# Patient Record
Sex: Male | Born: 1960 | Race: Black or African American | Hispanic: No | Marital: Single | Smoking: Former smoker
Health system: Southern US, Community
[De-identification: ages and names within clinical notes are randomized; demographics above are authoritative.]

## PROBLEM LIST (undated history)

## (undated) DIAGNOSIS — I10 Essential (primary) hypertension: Secondary | ICD-10-CM

## (undated) HISTORY — PX: TONSILLECTOMY: SUR1361

---

## 2001-05-20 ENCOUNTER — Emergency Department (HOSPITAL_COMMUNITY): Admission: EM | Admit: 2001-05-20 | Discharge: 2001-05-20 | Payer: Self-pay | Admitting: Emergency Medicine

## 2004-04-30 ENCOUNTER — Emergency Department (HOSPITAL_COMMUNITY): Admission: EM | Admit: 2004-04-30 | Discharge: 2004-04-30 | Payer: Self-pay | Admitting: Emergency Medicine

## 2006-05-19 ENCOUNTER — Emergency Department (HOSPITAL_COMMUNITY): Admission: EM | Admit: 2006-05-19 | Discharge: 2006-05-20 | Payer: Self-pay | Admitting: Emergency Medicine

## 2011-04-16 ENCOUNTER — Emergency Department (HOSPITAL_COMMUNITY)
Admission: EM | Admit: 2011-04-16 | Discharge: 2011-04-16 | Disposition: A | Discharge status: Home / Self Care | Payer: BC Managed Care – PPO | Attending: Emergency Medicine | Admitting: Emergency Medicine

## 2011-04-16 DIAGNOSIS — W540XXA Bitten by dog, initial encounter: Secondary | ICD-10-CM | POA: Insufficient documentation

## 2011-04-16 DIAGNOSIS — S61209A Unspecified open wound of unspecified finger without damage to nail, initial encounter: Secondary | ICD-10-CM | POA: Insufficient documentation

## 2011-12-18 HISTORY — PX: COLONOSCOPY: SHX174

## 2012-04-26 ENCOUNTER — Emergency Department (HOSPITAL_COMMUNITY): Payer: Worker's Compensation

## 2012-04-26 ENCOUNTER — Encounter (HOSPITAL_COMMUNITY): Payer: Self-pay | Admitting: *Deleted

## 2012-04-26 ENCOUNTER — Emergency Department (HOSPITAL_COMMUNITY)
Admission: EM | Admit: 2012-04-26 | Discharge: 2012-04-26 | Disposition: A | Discharge status: Home / Self Care | Payer: Worker's Compensation | Attending: Emergency Medicine | Admitting: Emergency Medicine

## 2012-04-26 DIAGNOSIS — W268XXA Contact with other sharp object(s), not elsewhere classified, initial encounter: Secondary | ICD-10-CM | POA: Insufficient documentation

## 2012-04-26 DIAGNOSIS — Y9289 Other specified places as the place of occurrence of the external cause: Secondary | ICD-10-CM | POA: Insufficient documentation

## 2012-04-26 DIAGNOSIS — I10 Essential (primary) hypertension: Secondary | ICD-10-CM | POA: Insufficient documentation

## 2012-04-26 DIAGNOSIS — S61512A Laceration without foreign body of left wrist, initial encounter: Secondary | ICD-10-CM

## 2012-04-26 DIAGNOSIS — S61509A Unspecified open wound of unspecified wrist, initial encounter: Secondary | ICD-10-CM | POA: Insufficient documentation

## 2012-04-26 HISTORY — DX: Essential (primary) hypertension: I10

## 2012-04-26 MED ORDER — BACITRACIN ZINC 500 UNIT/GM EX OINT
1.0000 "application " | TOPICAL_OINTMENT | Freq: Two times a day (BID) | CUTANEOUS | Status: DC
  Administered 2012-04-26: 1 via TOPICAL
  Filled 2012-04-26: qty 0.9

## 2012-04-26 MED ORDER — CEPHALEXIN 500 MG PO CAPS: 500.0000 mg | ORAL_CAPSULE | Freq: Two times a day (BID) | ORAL | Status: AC

## 2012-04-26 MED ORDER — BACITRACIN 500 UNIT/GM EX OINT: 1.0000 "application " | TOPICAL_OINTMENT | Freq: Two times a day (BID) | CUTANEOUS | Status: DC

## 2012-04-26 MED ORDER — LORAZEPAM 1 MG PO TABS
1.0000 mg | ORAL_TABLET | Freq: Once | ORAL | Status: AC
  Administered 2012-04-26: 1 mg via ORAL
  Filled 2012-04-26: qty 1

## 2012-04-26 NOTE — Discharge Instructions (Signed)
Please read the instructions below.  Keep your wound clean and covered with a thin layer of antibiotic ointment.  You should be seen by the ER, urgent care, or your primary care provider in 14 days for a wound check and suture removal.  Return to the ER immediately if you develop redness, swelling, pus draining from the wound, or fevers greater than 100.4.   You may return to the ER at any time for worsening condition or any new symptoms that concern you.  Laceration Care, Adult A laceration is a cut or lesion that goes through all layers of the skin and into the tissue just beneath the skin. TREATMENT  Some lacerations may not require closure. Some lacerations may not be able to be closed due to an increased risk of infection. It is important to see your caregiver as soon as possible after an injury to minimize the risk of infection and maximize the opportunity for successful closure. If closure is appropriate, pain medicines may be given, if needed. The wound will be cleaned to help prevent infection. Your caregiver will use stitches (sutures), staples, wound glue (adhesive), or skin adhesive strips to repair the laceration. These tools bring the skin edges together to allow for faster healing and a better cosmetic outcome. However, all wounds will heal with a scar. Once the wound has healed, scarring can be minimized by covering the wound with sunscreen during the day for 1 full year. HOME CARE INSTRUCTIONS  For sutures or staples:  Keep the wound clean and dry.   If you were given a bandage (dressing), you should change it at least once a day. Also, change the dressing if it becomes wet or dirty, or as directed by your caregiver.   Wash the wound with soap and water 2 times a day. Rinse the wound off with water to remove all soap. Pat the wound dry with a clean towel.   After cleaning, apply a thin layer of the antibiotic ointment as recommended by your caregiver. This will help prevent infection  and keep the dressing from sticking.   You may shower as usual after the first 24 hours. Do not soak the wound in water until the sutures are removed.   Only take over-the-counter or prescription medicines for pain, discomfort, or fever as directed by your caregiver.   Get your sutures or staples removed as directed by your caregiver.  For skin adhesive strips:  Keep the wound clean and dry.   Do not get the skin adhesive strips wet. You may bathe carefully, using caution to keep the wound dry.   If the wound gets wet, pat it dry with a clean towel.   Skin adhesive strips will fall off on their own. You may trim the strips as the wound heals. Do not remove skin adhesive strips that are still stuck to the wound. They will fall off in time.  For wound adhesive:  You may briefly wet your wound in the shower or bath. Do not soak or scrub the wound. Do not swim. Avoid periods of heavy perspiration until the skin adhesive has fallen off on its own. After showering or bathing, gently pat the wound dry with a clean towel.   Do not apply liquid medicine, cream medicine, or ointment medicine to your wound while the skin adhesive is in place. This may loosen the film before your wound is healed.   If a dressing is placed over the wound, be careful not to apply  tape directly over the skin adhesive. This may cause the adhesive to be pulled off before the wound is healed.   Avoid prolonged exposure to sunlight or tanning lamps while the skin adhesive is in place. Exposure to ultraviolet light in the first year will darken the scar.   The skin adhesive will usually remain in place for 5 to 10 days, then naturally fall off the skin. Do not pick at the adhesive film.  You may need a tetanus shot if:  You cannot remember when you had your last tetanus shot.   You have never had a tetanus shot.  If you get a tetanus shot, your arm may swell, get red, and feel warm to the touch. This is common and not a  problem. If you need a tetanus shot and you choose not to have one, there is a rare chance of getting tetanus. Sickness from tetanus can be serious. SEEK MEDICAL CARE IF:   You have redness, swelling, or increasing pain in the wound.   You see a red line that goes away from the wound.   You have yellowish-white fluid (pus) coming from the wound.   You have a fever.   You notice a bad smell coming from the wound or dressing.   Your wound breaks open before or after sutures have been removed.   You notice something coming out of the wound such as wood or glass.   Your wound is on your hand or foot and you cannot move a finger or toe.  SEEK IMMEDIATE MEDICAL CARE IF:   Your pain is not controlled with prescribed medicine.   You have severe swelling around the wound causing pain and numbness or a change in color in your arm, hand, leg, or foot.   Your wound splits open and starts bleeding.   You have worsening numbness, weakness, or loss of function of any joint around or beyond the wound.   You develop painful lumps near the wound or on the skin anywhere on your body.  MAKE SURE YOU:   Understand these instructions.   Will watch your condition.   Will get help right away if you are not doing well or get worse.  Document Released: 12/03/2005 Document Revised: 11/22/2011 Document Reviewed: 05/29/2011 [facility] Patient Information 2012 [place], [place].  Stitches, Staples, or Skin Adhesive Strips  Stitches (sutures), staples, and skin adhesive strips hold the skin together as it heals. They will usually be in place for 7 days or less. HOME CARE  Wash your hands with soap and water before and after you touch your wound.   Only take medicine as told by your doctor.   Cover your wound only if your doctor told you to. Otherwise, leave it open to air.   Do not get your stitches wet or dirty. If they get dirty, dab them gently with a clean washcloth. Wet the washcloth with soapy  water. Do not rub. Pat them dry gently.   Do not put medicine or medicated cream on your stitches unless your doctor told you to.   Do not take out your own stitches or staples. Skin adhesive strips will fall off by themselves.   Do not pick at the wound. Picking can cause an infection.   Do not miss your follow-up appointment.   If you have problems or questions, call your doctor.  GET HELP RIGHT AWAY IF:   You have a temperature by mouth above 102 F (38.9 C), not controlled  by medicine.   You have chills.   You have redness or pain around your stitches.   There is puffiness (swelling) around your stitches.   You notice fluid (drainage) from your stitches.   There is a bad smell coming from your wound.  MAKE SURE YOU:  Understand these instructions.   Will watch your condition.   Will get help if you are not doing well or get worse.  Document Released: 09/30/2009 Document Revised: 11/22/2011 Document Reviewed: 09/30/2009 [facility] Patient Information 2012 [place], [place].

## 2012-04-26 NOTE — ED Provider Notes (Signed)
History     CSN: [id number]  Arrival date & time 04/26/12  1009   First MD Initiated Contact with Patient 04/26/12 1011      Chief Complaint  Patient presents with  . 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  States he was seen at urgent care and sent to ED because it was "too deep."  Denies any weakness, numbness, tingling, or difficulty moving his hand.  Denies other injury.    The history is provided by the patient.    Past Medical History  Diagnosis Date  . Hypertension     History reviewed. No pertinent past surgical history.  History reviewed. No pertinent family history.  History  Substance Use Topics  . Smoking status: Current Everyday Smoker -- 2.0 packs/day    Types: Cigarettes  . Smokeless tobacco: Never Used  . Alcohol Use:      every weekends      Review of Systems  Skin: Positive for wound.  Neurological: Negative for weakness and numbness.    Allergies  Review of patient's allergies indicates no known allergies.  Home Medications   Current Outpatient Rx  Name Route Sig Dispense Refill  . HYDROCHLOROTHIAZIDE 25 MG PO TABS Oral Take 25 mg by mouth daily.      BP 169/110  Pulse 84  Temp(Src) 98.9 F (37.2 C) (Oral)  Resp 18  Wt 185 lb (83.915 kg)  SpO2 100%  Physical Exam  Nursing note and vitals reviewed. Constitutional: He is oriented to person, place, and time. He appears well-developed and well-nourished.  HENT:  Head: Normocephalic and atraumatic.  Neck: Neck supple.  Pulmonary/Chest: Effort normal.  Musculoskeletal:       Left wrist: He exhibits laceration. He exhibits normal range of motion.       Left hand: He exhibits normal range of motion and normal capillary refill. normal sensation  noted. Normal strength noted.       Laceration over left wrist into muscle and fat.  No Tendon involvement.  Patient with full AROM, 5/5 strength, full sensation throughout left hand and wrist, capillary refill < 2 seconds.    Neurological: He is alert and oriented to person, place, and time.  Skin: He is not diaphoretic.    ED Course  Procedures (including critical care time)  Labs Reviewed - No data to display No results found.  11:07 AM Patient also seen and examined by Dr Juleen China.  Neurovascularly intact.  Plan is for xray, thorough irrigation and closure in ED.  Pt very anxious about needles, will given ativan prior to procedure.    LACERATION REPAIR Performed by: Rise Patience Consent: Verbal consent obtained. Risks and benefits: risks, benefits and alternatives were discussed Patient identity confirmed: provided demographic data Time out performed prior to procedure Prepped and Draped in normal sterile fashion Wound explored  Laceration Location: left wrist  Laceration Length: 5cm  No Foreign Bodies seen or palpated  Anesthesia: local infiltration  Local anesthetic: lidocaine 2% no epinephrine  Anesthetic total: 5 ml  Irrigation method: syringe Amount of cleaning: standard  Skin closure: 4-0 vicryl and 4-0 prolene  Number of sutures or staples: 1 running and 9 simple interrupted  Technique: running subcuticular plus simple interrupted.   Patient tolerance: Patient tolerated the procedure well with no immediate complications.  Tetanus vx given last year.   1. Laceration of left wrist       MDM  Patient with deep laceration of left wrist, no tendon involvement.  Neurovascularly intact.  Tetanus is UTD.  Xray negative.  No FB seen on exam.  Wound thoroughly irrigated, closed with running subcuticular followed by simple interrupted sutures.  Wound hemostatic, came together well.  Pt advised he will have a scar.  Discussed return precautions.  Pt to return or see  PCP in 2 weeks for wound recheck and suture removal.  Patient and wife verbalize understanding and agree with plan.          Rise Patience, [place] 04/26/12 1510

## 2012-04-26 NOTE — ED Notes (Signed)
Patient transported to Xray with tech

## 2012-04-26 NOTE — ED Notes (Signed)
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 Pt reports being told at Urgent Care that laceration was too deep and that he needed to come to the ED.

## 2012-04-26 NOTE — ED Notes (Signed)
Pt returned from radiology with tech.

## 2012-04-28 NOTE — ED Provider Notes (Signed)
Medical screening examination/treatment/procedure(s) were conducted as a shared visit with non-physician practitioner(s) and myself.  I personally evaluated the patient during the encounter.  Patient with a complex laceration to the ulnar aspect of the distal left wrist. Subcutaneous fat exposed. Does appear to be a small muscle involvement. No evidence of tendon involvement and was visualized throughout range of motion of the wrist. Strength is good against resistance. Neurovascular intact distally. Plan very thorough irrigation. X-ray to evaluate for possible foreign body although doubt given mechanism described. Will require closure in layers. Continued wound care and followup for suture removal. Return precautions sooner were discussed.    Raeford Razor, MD 04/28/12 1038

## 2012-06-20 ENCOUNTER — Ambulatory Visit (AMBULATORY_SURGERY_CENTER): Payer: BC Managed Care – PPO | Admitting: *Deleted

## 2012-06-20 VITALS — Ht 69.0 in | Wt 174.5 lb

## 2012-06-20 DIAGNOSIS — Z1211 Encounter for screening for malignant neoplasm of colon: Secondary | ICD-10-CM

## 2012-06-20 MED ORDER — MOVIPREP 100 G PO SOLR: ORAL | Status: DC

## 2012-06-20 NOTE — Progress Notes (Signed)
Pt has no allergies to soy or egg products

## 2012-06-23 ENCOUNTER — Encounter: Payer: Self-pay | Admitting: Gastroenterology

## 2012-06-24 NOTE — Addendum Note (Signed)
Addended by: Maple Hudson on: 06/24/2012 04:25 PM   Modules accepted: Level of Service

## 2012-07-02 ENCOUNTER — Encounter: Payer: Self-pay | Admitting: Gastroenterology

## 2012-07-02 ENCOUNTER — Ambulatory Visit (AMBULATORY_SURGERY_CENTER): Payer: BC Managed Care – PPO | Admitting: Gastroenterology

## 2012-07-02 VITALS — BP 132/91 | HR 69 | Temp 97.0°F | Resp 18 | Ht 69.0 in | Wt 174.0 lb

## 2012-07-02 DIAGNOSIS — K5730 Diverticulosis of large intestine without perforation or abscess without bleeding: Secondary | ICD-10-CM

## 2012-07-02 DIAGNOSIS — Z1211 Encounter for screening for malignant neoplasm of colon: Secondary | ICD-10-CM

## 2012-07-02 DIAGNOSIS — D126 Benign neoplasm of colon, unspecified: Secondary | ICD-10-CM

## 2012-07-02 MED ORDER — SODIUM CHLORIDE 0.9 % IV SOLN: 500.0000 mL | INTRAVENOUS | Status: DC

## 2012-07-02 NOTE — Op Note (Signed)
[facility] [address] [place], [place]  [address]  COLONOSCOPY PROCEDURE REPORT  PATIENT:  Dean, Brooks  MR#:  [id number] BIRTHDATE:  06/23/1961, 50 yrs. old  GENDER:  male ENDOSCOPIST:  Vania Rea. Jarold Motto, MD, [facility] REF. BY: PROCEDURE DATE:  07/02/2012 PROCEDURE:  Colonoscopy with snare polypectomy ASA CLASS:  Class II INDICATIONS:  Routine Risk Screening MEDICATIONS:   propofol (Diprivan) 250 mg IV  DESCRIPTION OF PROCEDURE:   After the risks and benefits and of the procedure were explained, informed consent was obtained. Digital rectal exam was performed and revealed no abnormalities. The LB CF-[id number] endoscope was introduced through the anus and advanced to the cecum, which was identified by both the appendix and ileocecal valve.  The quality of the prep was excellent, using MoviPrep.  The instrument was then slowly withdrawn as the colon was fully examined. <<PROCEDUREIMAGES>>  FINDINGS:  Moderate diverticulosis was found in the right colon. see pictures.large mouthed diverticulae in right colon,also small rectal hyperplastic nodules noted.  A sessile polyp was found in the sigmoid colon. a 2-3mm flat polyp cold snare excised.no tissue obtained   Retroflexed views in the rectum revealed no abnormalities.    The scope was then withdrawn from the patient and the procedure completed.  COMPLICATIONS:  None ENDOSCOPIC IMPRESSION: 1) Moderate diverticulosis in the right colon 2) Sessile polyp in the sigmoid colon RECOMMENDATIONS: 1) High fiber diet. 2) Repeat Colonoscopy in 5 years.  REPEAT EXAM:  No  ______________________________ Vania Rea. Jarold Motto, MD, [facility]  CC:  n. eSIGNED:   Vania Rea. Truth Barot at 07/02/2012 02:03 PM  [name], [phone]

## 2012-07-02 NOTE — Progress Notes (Signed)
Patient did not experience any of the following events: a burn prior to discharge; a fall within the facility; wrong site/side/patient/procedure/implant event; or a hospital transfer or hospital admission upon discharge from the facility. (G8907) Patient did not have preoperative order for IV antibiotic SSI prophylaxis. (G8918)  

## 2012-07-02 NOTE — Patient Instructions (Addendum)
Discharge instructions given with verbal understanding. Handouts on diverticulosis and a high fiber diet given. Resume previous medications.YOU HAD AN ENDOSCOPIC PROCEDURE TODAY AT THE [facility]: Refer to the procedure report that was given to you for any specific questions about what was found during the examination.  If the procedure report does not answer your questions, please call your gastroenterologist to clarify.  If you requested that your care partner not be given the details of your procedure findings, then the procedure report has been included in a sealed envelope for you to review at your convenience later.  YOU SHOULD EXPECT: Some feelings of bloating in the abdomen. Passage of more gas than usual.  Walking can help get rid of the air that was put into your GI tract during the procedure and reduce the bloating. If you had a lower endoscopy (such as a colonoscopy or flexible sigmoidoscopy) you may notice spotting of blood in your stool or on the toilet paper. If you underwent a bowel prep for your procedure, then you may not have a normal bowel movement for a few days.  DIET: Your first meal following the procedure should be a light meal and then it is ok to progress to your normal diet.  A half-sandwich or bowl of soup is an example of a good first meal.  Heavy or fried foods are harder to digest and may make you feel nauseous or bloated.  Likewise meals heavy in dairy and vegetables can cause extra gas to form and this can also increase the bloating.  Drink plenty of fluids but you should avoid alcoholic beverages for 24 hours.  ACTIVITY: Your care partner should take you home directly after the procedure.  You should plan to take it easy, moving slowly for the rest of the day.  You can resume normal activity the day after the procedure however you should NOT DRIVE or use heavy machinery for 24 hours (because of the sedation medicines used during the test).    SYMPTOMS TO  REPORT IMMEDIATELY: A gastroenterologist can be reached at any hour.  During normal business hours, 8:30 AM to 5:00 PM Monday through Friday, call [phone].  After hours and on weekends, please call the GI answering service at [phone] who will take a message and have the physician on call contact you.   Following lower endoscopy (colonoscopy or flexible sigmoidoscopy):  Excessive amounts of blood in the stool  Significant tenderness or worsening of abdominal pains  Swelling of the abdomen that is new, acute  Fever of 100F or higher  FOLLOW UP: If any biopsies were taken you will be contacted by phone or by letter within the next 1-3 weeks.  Call your gastroenterologist if you have not heard about the biopsies in 3 weeks.  Our staff will call the home number listed on your records the next business day following your procedure to check on you and address any questions or concerns that you may have at that time regarding the information given to you following your procedure. This is a courtesy call and so if there is no answer at the home number and we have not heard from you through the emergency physician on call, we will assume that you have returned to your regular daily activities without incident.  SIGNATURES/CONFIDENTIALITY: You and/or your care partner have signed paperwork which will be entered into your electronic medical record.  These signatures attest to the fact that that the information above on your   After Visit Summary has been reviewed and is understood.  Full responsibility of the confidentiality of this discharge information lies with you and/or your care-partner.   

## 2012-07-03 ENCOUNTER — Telehealth: Payer: Self-pay

## 2012-07-03 NOTE — Telephone Encounter (Signed)
  Follow up Call-  Call back number 07/02/2012  Post procedure Call Back phone  # [phone]  Permission to leave phone message Yes     Patient questions:  Do you have a fever, pain , or abdominal swelling? no Pain Score  0 *  Have you tolerated food without any problems? yes  Have you been able to return to your normal activities? yes  Do you have any questions about your discharge instructions: Diet   no Medications  no Follow up visit  no  Do you have questions or concerns about your Care? no  Actions: * If pain score is 4 or above: No action needed, pain <4.

## 2013-06-03 ENCOUNTER — Observation Stay (HOSPITAL_COMMUNITY)
Admission: EM | Admit: 2013-06-03 | Discharge: 2013-06-04 | Disposition: A | Discharge status: Home / Self Care | Payer: BC Managed Care – PPO | Attending: Internal Medicine | Admitting: Internal Medicine

## 2013-06-03 ENCOUNTER — Encounter (HOSPITAL_COMMUNITY): Payer: Self-pay | Admitting: Emergency Medicine

## 2013-06-03 ENCOUNTER — Emergency Department (HOSPITAL_COMMUNITY): Payer: BC Managed Care – PPO

## 2013-06-03 DIAGNOSIS — R5383 Other fatigue: Secondary | ICD-10-CM | POA: Insufficient documentation

## 2013-06-03 DIAGNOSIS — T675XXA Heat exhaustion, unspecified, initial encounter: Principal | ICD-10-CM | POA: Insufficient documentation

## 2013-06-03 DIAGNOSIS — R7309 Other abnormal glucose: Secondary | ICD-10-CM | POA: Diagnosis present

## 2013-06-03 DIAGNOSIS — N179 Acute kidney failure, unspecified: Secondary | ICD-10-CM | POA: Diagnosis present

## 2013-06-03 DIAGNOSIS — F1010 Alcohol abuse, uncomplicated: Secondary | ICD-10-CM | POA: Insufficient documentation

## 2013-06-03 DIAGNOSIS — I10 Essential (primary) hypertension: Secondary | ICD-10-CM

## 2013-06-03 DIAGNOSIS — N19 Unspecified kidney failure: Secondary | ICD-10-CM

## 2013-06-03 DIAGNOSIS — R7989 Other specified abnormal findings of blood chemistry: Secondary | ICD-10-CM | POA: Insufficient documentation

## 2013-06-03 DIAGNOSIS — X30XXXA Exposure to excessive natural heat, initial encounter: Secondary | ICD-10-CM | POA: Insufficient documentation

## 2013-06-03 DIAGNOSIS — R5381 Other malaise: Secondary | ICD-10-CM | POA: Insufficient documentation

## 2013-06-03 DIAGNOSIS — E860 Dehydration: Secondary | ICD-10-CM | POA: Diagnosis present

## 2013-06-03 LAB — CBC WITH DIFFERENTIAL/PLATELET
Basophils Absolute: 0 10*3/uL (ref 0.0–0.1)
Eosinophils Absolute: 0 10*3/uL (ref 0.0–0.7)
HCT: 42.5 % (ref 39.0–52.0)
Lymphs Abs: 1.6 10*3/uL (ref 0.7–4.0)
MCH: 29.4 pg (ref 26.0–34.0)
Monocytes Relative: 8 % (ref 3–12)
Neutro Abs: 10.4 10*3/uL — ABNORMAL HIGH (ref 1.7–7.7)
Neutrophils Relative %: 80 % — ABNORMAL HIGH (ref 43–77)
Platelets: 292 10*3/uL (ref 150–400)
RBC: 5.13 MIL/uL (ref 4.22–5.81)
RDW: 13.9 % (ref 11.5–15.5)

## 2013-06-03 LAB — COMPREHENSIVE METABOLIC PANEL
ALT: 44 U/L (ref 0–53)
AST: 43 U/L — ABNORMAL HIGH (ref 0–37)
BUN: 35 mg/dL — ABNORMAL HIGH (ref 6–23)
CO2: 17 mEq/L — ABNORMAL LOW (ref 19–32)
Calcium: 11.1 mg/dL — ABNORMAL HIGH (ref 8.4–10.5)
Chloride: 93 mEq/L — ABNORMAL LOW (ref 96–112)
Creatinine, Ser: 2.14 mg/dL — ABNORMAL HIGH (ref 0.50–1.35)
GFR calc non Af Amer: 34 mL/min — ABNORMAL LOW (ref >90)
Glucose, Bld: 252 mg/dL — ABNORMAL HIGH (ref 70–99)
Potassium: 4.5 mEq/L (ref 3.5–5.1)
Total Bilirubin: 0.4 mg/dL (ref 0.3–1.2)

## 2013-06-03 LAB — CK TOTAL AND CKMB (NOT AT ARMC)
Relative Index: 0.9 (ref 0.0–2.5)
Total CK: 493 U/L — ABNORMAL HIGH (ref 7–232)

## 2013-06-03 LAB — D-DIMER, QUANTITATIVE: D-Dimer, Quant: 0.62 ug/mL-FEU — ABNORMAL HIGH (ref 0.00–0.48)

## 2013-06-03 LAB — GLUCOSE, CAPILLARY

## 2013-06-03 MED ORDER — HEPARIN SODIUM (PORCINE) 5000 UNIT/ML IJ SOLN
5000.0000 [IU] | Freq: Three times a day (TID) | INTRAMUSCULAR | Status: DC
  Administered 2013-06-04 (×2): 5000 [IU] via SUBCUTANEOUS
  Filled 2013-06-03 (×5): qty 1

## 2013-06-03 MED ORDER — INSULIN ASPART 100 UNIT/ML ~~LOC~~ SOLN: 0.0000 [IU] | Freq: Three times a day (TID) | SUBCUTANEOUS | Status: DC

## 2013-06-03 MED ORDER — SODIUM CHLORIDE 0.9 % IV SOLN
INTRAVENOUS | Status: DC
  Administered 2013-06-03 – 2013-06-04 (×2): via INTRAVENOUS

## 2013-06-03 MED ORDER — ADULT MULTIVITAMIN W/MINERALS CH
1.0000 | ORAL_TABLET | Freq: Every day | ORAL | Status: DC
Start: 2013-06-04 — End: 2013-06-04
  Administered 2013-06-04: 1 via ORAL
  Filled 2013-06-03: qty 1

## 2013-06-03 MED ORDER — LORAZEPAM 1 MG PO TABS: 1.0000 mg | ORAL_TABLET | Freq: Four times a day (QID) | ORAL | Status: DC | PRN

## 2013-06-03 MED ORDER — SODIUM CHLORIDE 0.9 % IV BOLUS (SEPSIS)
1000.0000 mL | Freq: Once | INTRAVENOUS | Status: AC
  Administered 2013-06-03: 1000 mL via INTRAVENOUS

## 2013-06-03 MED ORDER — THIAMINE HCL 100 MG/ML IJ SOLN
100.0000 mg | Freq: Every day | INTRAMUSCULAR | Status: DC
  Filled 2013-06-03: qty 1

## 2013-06-03 MED ORDER — VITAMIN B-1 100 MG PO TABS
100.0000 mg | ORAL_TABLET | Freq: Every day | ORAL | Status: DC
  Administered 2013-06-04: 100 mg via ORAL
  Filled 2013-06-03: qty 1

## 2013-06-03 MED ORDER — LORAZEPAM 2 MG/ML IJ SOLN: 1.0000 mg | Freq: Four times a day (QID) | INTRAMUSCULAR | Status: DC | PRN

## 2013-06-03 MED ORDER — FOLIC ACID 1 MG PO TABS
1.0000 mg | ORAL_TABLET | Freq: Every day | ORAL | Status: DC
  Administered 2013-06-04: 1 mg via ORAL
  Filled 2013-06-03: qty 1

## 2013-06-03 MED ORDER — SODIUM CHLORIDE 0.9 % IV SOLN
INTRAVENOUS | Status: DC
  Administered 2013-06-03 – 2013-06-04 (×2): via INTRAVENOUS

## 2013-06-03 NOTE — ED Notes (Signed)
Patient presents to ED with c/o shortness of breath.  Per pt, he was out in the garden under the sun when he started feeling short of breath.

## 2013-06-03 NOTE — H&P (Signed)
[facility] History and Physical  Dean Brooks UJW:[id number] DOB: [birth year] DOA: 06/03/2013  Referring physician: Dr. Freida Busman PCP: No primary provider on file.  Specialists: none  Chief Complaint: Weakness  HPI: Dean Brooks is a 52 y.o. male has a past medical history significant for hypertension , presents today to the emergency room with a chief complaint of weakness and shortness of breath. Patient has worked 12 hours today in the sun, and towards the end of the day has noted that he feels very fatigued, has muscle cramps, and overall is very weak. He denies any chest pain, denies any cough, denies any fever. He initially complained of some shortness of breath while he was out in the sun, however felt much better after resting endorse. He denies any nausea, vomiting or diarrhea. He denies any fever or chills. He denies any lightheadedness or dizziness. In the emergency room, his labs were pertinent for a creatinine of 2.1, elevated sugars to 350, elevated CK and leukocytosis. Endorses daily alcohol use, drinks 3 x 24 ounce beers per day  Review of Systems: As per history of present illness, otherwise negative.  Past Medical History  Diagnosis Date  . Hypertension    Past Surgical History  Procedure Laterality Date  . Tonsillectomy     Social History:  reports that he quit smoking about a year ago. His smoking use included Cigarettes. He smoked 0.00 packs per day. He has never used smokeless tobacco. He reports that  drinks alcohol. He reports that he does not use illicit drugs.  No Known Allergies  Family History  Problem Relation Age of Onset  . Colon cancer Neg Hx   . Esophageal cancer Neg Hx   . Rectal cancer Neg Hx   . Stomach cancer Neg Hx      Prior to Admission medications   Medication Sig Start Date End Date Taking? Authorizing Provider  amLODipine (NORVASC) 5 MG tablet Take 5 mg by mouth daily.   Yes Historical Provider, MD  hydrochlorothiazide  (HYDRODIURIL) 25 MG tablet Take 25 mg by mouth daily.   Yes Historical Provider, MD   Physical Exam: Filed Vitals:   06/03/13 2104 06/03/13 2130 06/03/13 2244  BP: 145/93 143/80 135/53  Pulse:  90   Temp: 98.6 F (37 C)    TempSrc: Oral    Resp: 20 21 13   SpO2: 100% 96% 99%     General:  No apparent distress  Eyes: PERRL, EOMI,  ENT: moist oropharynx  Neck: supple, no JVD  Cardiovascular: regular rate without MRG; 2+ peripheral pulses  Respiratory: CTA biL, good air movement without wheezing, rhonchi or crackled  Abdomen: soft, non tender to palpation, positive bowel sounds, no guarding, no rebound  Skin: no rashes  Musculoskeletal: no peripheral edema  Psychiatric: normal mood and affect  Neurologic: CN 2-12 grossly intact, MS 5/5 in all 4  Labs on Admission:  Basic Metabolic Panel:  Recent Labs Lab 06/03/13 2105  NA 133*  K 4.5  CL 93*  CO2 17*  GLUCOSE 252*  BUN 35*  CREATININE 2.14*  CALCIUM 11.1*   Liver Function Tests:  Recent Labs Lab 06/03/13 2105  AST 43*  ALT 44  ALKPHOS 45  BILITOT 0.4  PROT 9.1*  ALBUMIN 5.0   CBC:  Recent Labs Lab 06/03/13 2105  WBC 13.0*  NEUTROABS 10.4*  HGB 15.1  HCT 42.5  MCV 82.8  PLT 292   Cardiac Enzymes:  Recent Labs Lab 06/03/13 2105  CKTOTAL 493*  CKMB  4.4*    CBG:  Recent Labs Lab 06/03/13 2111  GLUCAP 353*    Radiological Exams on Admission: Dg Chest Portable 1 View  06/03/2013   *RADIOLOGY REPORT*  Clinical Data: Shortness of breath.  PORTABLE CHEST - 1 VIEW  Comparison: Chest x-ray 05/19/2006.  Findings: Lung volumes are normal.  No consolidative airspace disease.  No pleural effusions.  No pneumothorax.  No pulmonary nodule or mass noted.  Pulmonary vasculature and the cardiomediastinal silhouette are within normal limits.  IMPRESSION: 1. No radiographic evidence of acute cardiopulmonary disease.   Original Report Authenticated By: Trudie Reed, M.D.    EKG: Independently  reviewed.  Assessment/Plan Active Problems:   AKI (acute kidney injury)   Dehydration   Hypercalcemia   Elevated glucose  Acute kidney injury - likely due to dehydration. Continue IV fluids and monitor BMP in the morning. Hyper glycemia - query underlying diabetes as he was told in the past is borderline elevated sugars. We'll check a hemoglobin A1c sliding scale insulin here. Elevated CK - we'll check a troponin and EKG given shortness or breath. Hypercalcemia - will monitor with hydration Hypertension - we'll hold antihypertensives especially his diuretic. Alcohol abuse - CIWA. DVT prophylaxis - Heparin subcutaneous   Code Status: presumed full  Family Communication: wife  Disposition Plan: obs  Time spent: 66  Costin M. Elvera Lennox, MD [facility] Pager [phone]  If 7PM-7AM, please contact night-coverage [email or web address] Password [facility] 06/03/2013, 11:02 PM

## 2013-06-03 NOTE — ED Notes (Signed)
Floor Unit RN unavailable to take report on pt at this time. 

## 2013-06-03 NOTE — ED Notes (Signed)
Bed:WA22<BR> Expected date:<BR> Expected time:<BR> Means of arrival:<BR> Comments:<BR> triage

## 2013-06-03 NOTE — ED Provider Notes (Signed)
History     CSN: [id number]  Arrival date & time 06/03/13  2104   First MD Initiated Contact with Patient 06/03/13 2115      Chief Complaint  Patient presents with  . Shortness of Breath    (Consider location/radiation/quality/duration/timing/severity/associated sxs/prior treatment) Patient is a 52 y.o. male presenting with shortness of breath. The history is provided by the patient.  Shortness of Breath pt here with sob after being outside in the warm environment--pt notes whole body cramps as well--no chest pain, cough, or fever--sx better with being indoors and getting iv fluids--denies and dark urine--no tx used pta, no prior h/o same--he denies any polyuria or polydipsia  Past Medical History  Diagnosis Date  . Hypertension     Past Surgical History  Procedure Laterality Date  . Tonsillectomy      Family History  Problem Relation Age of Onset  . Colon cancer Neg Hx   . Esophageal cancer Neg Hx   . Rectal cancer Neg Hx   . Stomach cancer Neg Hx     History  Substance Use Topics  . Smoking status: Former Smoker -- 0.00 packs/day    Types: Cigarettes    Quit date: 06/03/2012  . Smokeless tobacco: Never Used  . Alcohol Use: Yes     Comment: every weekends      Review of Systems  Respiratory: Positive for shortness of breath.   All other systems reviewed and are negative.    Allergies  Review of patient's allergies indicates no known allergies.  Home Medications   Current Outpatient Rx  Name  Route  Sig  Dispense  Refill  . AMLODIPINE BESYLATE PO      1 tablet Daily.         . hydrochlorothiazide (HYDRODIURIL) 25 MG tablet   Oral   Take 25 mg by mouth daily.           BP 145/93  Temp(Src) 98.6 F (37 C) (Oral)  Resp 20  SpO2 100%  Physical Exam  Nursing note and vitals reviewed. Constitutional: He is oriented to person, place, and time. He appears well-developed and well-nourished.  Non-toxic appearance. No distress.  HENT:  Head:  Normocephalic and atraumatic.  Eyes: Conjunctivae, EOM and lids are normal. Pupils are equal, round, and reactive to light.  Neck: Normal range of motion. Neck supple. No tracheal deviation present. No mass present.  Cardiovascular: Normal rate, regular rhythm and normal heart sounds.  Exam reveals no gallop.   No murmur heard. Pulmonary/Chest: Effort normal and breath sounds normal. No stridor. No respiratory distress. He has no decreased breath sounds. He has no wheezes. He has no rhonchi. He has no rales.  Abdominal: Soft. Normal appearance and bowel sounds are normal. He exhibits no distension. There is no tenderness. There is no rebound and no CVA tenderness.  Musculoskeletal: Normal range of motion. He exhibits no edema and no tenderness.  Neurological: He is alert and oriented to person, place, and time. He has normal strength. No cranial nerve deficit or sensory deficit. GCS eye subscore is 4. GCS verbal subscore is 5. GCS motor subscore is 6.  Skin: Skin is warm and dry. No abrasion and no rash noted.  Psychiatric: He has a normal mood and affect. His speech is normal and behavior is normal.    ED Course  Procedures (including critical care time)  Labs Reviewed  CBC WITH DIFFERENTIAL - Abnormal; Notable for the following:    WBC 13.0 (*)  Neutrophils Relative % 80 (*)    Neutro Abs 10.4 (*)    All other components within normal limits  D-DIMER, QUANTITATIVE - Abnormal; Notable for the following:    D-Dimer, Quant 0.62 (*)    All other components within normal limits  GLUCOSE, CAPILLARY - Abnormal; Notable for the following:    Glucose-Capillary 353 (*)    All other components within normal limits  COMPREHENSIVE METABOLIC PANEL  CK TOTAL AND CKMB   No results found.   No diagnosis found.    MDM  Pt given iv fluids here for dheydration and renal insuff, will be admitted by [facility]        Toy Baker, MD 06/03/13 2253

## 2013-06-04 DIAGNOSIS — R7309 Other abnormal glucose: Secondary | ICD-10-CM

## 2013-06-04 DIAGNOSIS — I10 Essential (primary) hypertension: Secondary | ICD-10-CM

## 2013-06-04 DIAGNOSIS — E860 Dehydration: Secondary | ICD-10-CM

## 2013-06-04 DIAGNOSIS — N179 Acute kidney failure, unspecified: Secondary | ICD-10-CM

## 2013-06-04 DIAGNOSIS — N19 Unspecified kidney failure: Secondary | ICD-10-CM

## 2013-06-04 LAB — BASIC METABOLIC PANEL
BUN: 26 mg/dL — ABNORMAL HIGH (ref 6–23)
Chloride: 100 mEq/L (ref 96–112)
GFR calc Af Amer: 83 mL/min — ABNORMAL LOW (ref >90)
GFR calc non Af Amer: 72 mL/min — ABNORMAL LOW (ref >90)
Potassium: 3.3 mEq/L — ABNORMAL LOW (ref 3.5–5.1)
Sodium: 131 mEq/L — ABNORMAL LOW (ref 135–145)

## 2013-06-04 LAB — HEMOGLOBIN A1C
Hgb A1c MFr Bld: 5.9 % — ABNORMAL HIGH (ref <5.7)
Mean Plasma Glucose: 123 mg/dL — ABNORMAL HIGH (ref <117)

## 2013-06-04 LAB — TROPONIN I: Troponin I: <0.3 ng/mL (ref <0.30)

## 2013-06-04 LAB — CBC
HCT: 37.5 % — ABNORMAL LOW (ref 39.0–52.0)
Hemoglobin: 12.6 g/dL — ABNORMAL LOW (ref 13.0–17.0)
MCHC: 33.6 g/dL (ref 30.0–36.0)
RBC: 4.46 MIL/uL (ref 4.22–5.81)
WBC: 9.4 10*3/uL (ref 4.0–10.5)

## 2013-06-04 LAB — GLUCOSE, CAPILLARY: Glucose-Capillary: 110 mg/dL — ABNORMAL HIGH (ref 70–99)

## 2013-06-04 LAB — CK TOTAL AND CKMB (NOT AT ARMC)
CK, MB: 6.1 ng/mL (ref 0.3–4.0)
Relative Index: 0.9 (ref 0.0–2.5)
Total CK: 697 U/L — ABNORMAL HIGH (ref 7–232)

## 2013-06-04 MED ORDER — HYDROCHLOROTHIAZIDE 25 MG PO TABS: 25.0000 mg | ORAL_TABLET | Freq: Every day | ORAL | Status: AC

## 2013-06-04 MED ORDER — POTASSIUM CHLORIDE CRYS ER 20 MEQ PO TBCR
20.0000 meq | EXTENDED_RELEASE_TABLET | Freq: Once | ORAL | Status: AC
  Administered 2013-06-04: 20 meq via ORAL
  Filled 2013-06-04: qty 1

## 2013-06-04 NOTE — Progress Notes (Signed)
Patient discharged home in stable condition.  No change from morning assessment.  Instructions given to patient and spouse.  No further questions at this time.

## 2013-06-04 NOTE — Discharge Summary (Addendum)
Physician Discharge Summary  Dean Brooks JWJ:[id number] DOB: Sep 17, 1961 DOA: 06/03/2013  PCP: No primary provider on file.  Admit date: 06/03/2013 Discharge date: 06/04/2013  Time spent: 35 minutes  Recommendations for Outpatient Follow-up:  1. PCP at [facility] in 1 week  Discharge Diagnoses:   Heat exhaustion   AKI (acute kidney injury)   Dehydration   Hypercalcemia   Borderline DM   Hypertension   Alcohol abuse, daily use   Discharge Condition: improved  Diet recommendation: carb modified, low sodium  Filed Weights   06/03/13 2346  Weight: 82.736 kg (182 lb 6.4 oz)    History of present illness:  Dean Brooks is a 52 y.o. male has a past medical history significant for hypertension , presents today to the emergency room with a chief complaint of weakness and shortness of breath. Patient has worked 12 hours today in the sun, and towards the end of the day has noted that he feels very fatigued, has muscle cramps, and overall is very weak. He denies any chest pain, denies any cough, denies any fever. He initially complained of some shortness of breath while he was out in the sun, however felt much better after resting endorse. He denies any nausea, vomiting or diarrhea. He denies any fever or chills. He denies any lightheadedness or dizziness. In the emergency room, his labs were pertinent for a creatinine of 2.1, elevated sugars to 350, elevated CK and leukocytosis.  Endorses daily alcohol use, drinks 3 x 24 ounce beers per day   Hospital Course:   His Acute renal failure, elevated CPK,  metabolic acidosis and hypercalcemia were felt to be the result of heat exhaustion, dehydration: he immediately improved with aggressive hydration, his kidney function, bicarb, calcium levels normalized and clinically pt felt much improved and was adamant to be discharged, his CK is mildly elevated, he is advised to drink plenty of fluids and hold his diuretic for the next 1  week  Hyperglycemia: known borderline DM, Hbaic 5.9, life style/diet modification advised and this needs to be followed by his PCP  Discharge Exam: Filed Vitals:   06/03/13 2244 06/03/13 2300 06/03/13 2346 06/04/13 0615  BP: 135/53 135/81 132/85 106/66  Pulse:  82 68 62  Temp:   99.1 F (37.3 C) 98.3 F (36.8 C)  TempSrc:   Oral Oral  Resp: 13 18 18 18   Height:   5\' 9"  (1.753 m)   Weight:   82.736 kg (182 lb 6.4 oz)   SpO2: 99% 99% 100% 100%    General: AAOx3 Cardiovascular: S1S2/RRR Respiratory: CTAB  Discharge Instructions  Discharge Orders   Future Orders Complete By Expires     Diet - low sodium heart healthy  As directed     Diet Carb Modified  As directed     Discharge instructions  As directed     Comments:      Drink plenty of fluids, especially if out in sun    Increase activity slowly  As directed         Medication List    TAKE these medications       amLODipine 5 MG tablet  Commonly known as:  NORVASC  Take 5 mg by mouth daily.     hydrochlorothiazide 25 MG tablet  Commonly known as:  HYDRODIURIL  Take 1 tablet (25 mg total) by mouth daily. Hold for 1 week then restart       No Known Allergies     Follow-up Information  Follow up with PCP at [facility]. Schedule an appointment as soon as possible for a visit in 1 week.       The results of significant diagnostics from this hospitalization (including imaging, microbiology, ancillary and laboratory) are listed below for reference.    Significant Diagnostic Studies: Dg Chest Portable 1 View  06/03/2013   *RADIOLOGY REPORT*  Clinical Data: Shortness of breath.  PORTABLE CHEST - 1 VIEW  Comparison: Chest x-ray 05/19/2006.  Findings: Lung volumes are normal.  No consolidative airspace disease.  No pleural effusions.  No pneumothorax.  No pulmonary nodule or mass noted.  Pulmonary vasculature and the cardiomediastinal silhouette are within normal limits.  IMPRESSION: 1. No radiographic evidence of  acute cardiopulmonary disease.   Original Report Authenticated By: Trudie Reed, M.D.    Microbiology: No results found for this or any previous visit (from the past 240 hour(s)).   Labs: Basic Metabolic Panel:  Recent Labs Lab 06/03/13 2105 06/04/13 0600  NA 133* 131*  K 4.5 3.3*  CL 93* 100  CO2 17* 21  GLUCOSE 252* 115*  BUN 35* 26*  CREATININE 2.14* 1.15  CALCIUM 11.1* 9.2   Liver Function Tests:  Recent Labs Lab 06/03/13 2105  AST 43*  ALT 44  ALKPHOS 45  BILITOT 0.4  PROT 9.1*  ALBUMIN 5.0   No results found for this basename: LIPASE, AMYLASE,  in the last 168 hours No results found for this basename: AMMONIA,  in the last 168 hours CBC:  Recent Labs Lab 06/03/13 2105 06/04/13 0600  WBC 13.0* 9.4  NEUTROABS 10.4*  --   HGB 15.1 12.6*  HCT 42.5 37.5*  MCV 82.8 84.1  PLT 292 218   Cardiac Enzymes:  Recent Labs Lab 06/03/13 2105 06/04/13 0600  CKTOTAL 493* 697*  CKMB 4.4* 6.1*  TROPONINI <0.30 <0.30   BNP: BNP (last 3 results) No results found for this basename: PROBNP,  in the last 8760 hours CBG:  Recent Labs Lab 06/03/13 2111 06/04/13 1013  GLUCAP 353* 110*       Signed:  [name]  [facility] 06/04/2013, 2:37 PM

## 2017-03-31 ENCOUNTER — Emergency Department (HOSPITAL_COMMUNITY)
Admission: EM | Admit: 2017-03-31 | Discharge: 2017-03-31 | Disposition: A | Discharge status: Home / Self Care | Payer: BLUE CROSS/BLUE SHIELD | Attending: Emergency Medicine | Admitting: Emergency Medicine

## 2017-03-31 ENCOUNTER — Encounter (HOSPITAL_COMMUNITY): Payer: Self-pay | Admitting: Emergency Medicine

## 2017-03-31 DIAGNOSIS — Z79899 Other long term (current) drug therapy: Secondary | ICD-10-CM | POA: Diagnosis not present

## 2017-03-31 DIAGNOSIS — Z87891 Personal history of nicotine dependence: Secondary | ICD-10-CM | POA: Insufficient documentation

## 2017-03-31 DIAGNOSIS — S80862A Insect bite (nonvenomous), left lower leg, initial encounter: Secondary | ICD-10-CM | POA: Insufficient documentation

## 2017-03-31 DIAGNOSIS — Y999 Unspecified external cause status: Secondary | ICD-10-CM | POA: Diagnosis not present

## 2017-03-31 DIAGNOSIS — W57XXXA Bitten or stung by nonvenomous insect and other nonvenomous arthropods, initial encounter: Secondary | ICD-10-CM | POA: Insufficient documentation

## 2017-03-31 DIAGNOSIS — Y929 Unspecified place or not applicable: Secondary | ICD-10-CM | POA: Insufficient documentation

## 2017-03-31 DIAGNOSIS — I10 Essential (primary) hypertension: Secondary | ICD-10-CM | POA: Diagnosis not present

## 2017-03-31 DIAGNOSIS — Y939 Activity, unspecified: Secondary | ICD-10-CM | POA: Diagnosis not present

## 2017-03-31 NOTE — ED Triage Notes (Signed)
Patient states that when he woke up this morning he felt something on left posterior left leg.  Patient thought is was scab at first but states its a tick. Patient scared to pull it off himself because afraid wouldn't get head out. patient reports putting alcohol on tick but still didn't come loose.

## 2017-03-31 NOTE — ED Provider Notes (Signed)
Dean Brooks DEPT [facility] Provider Note   CSN: [id number] Arrival date & time: 03/31/17  8416     History   Chief Complaint Chief Complaint  Patient presents with  . tick on leg    HPI Dean Brooks is a 56 y.o. male who presents to the Emergency Department for tick removal. He reports he awoke this morning and felt something on his left leg. He thought it was a scab at first, but then realized it was a tick. He researched how to pull it off, but was afraid he would not be able to remove the tick with its head intact. He attempted to kill the tick by applying alcohol, which was unsuccessful. He denies a rash, fever, or chills. PMH includes HTN.  The history is provided by the patient. No language interpreter was used.    Past Medical History:  Diagnosis Date  . Hypertension     Patient Active Problem List   Diagnosis Date Noted  . AKI (acute kidney injury) ([place]) 06/03/2013  . Dehydration 06/03/2013  . Hypercalcemia 06/03/2013  . Elevated glucose 06/03/2013  . Hypertension 06/03/2013  . Alcohol abuse, daily use 06/03/2013    Past Surgical History:  Procedure Laterality Date  . TONSILLECTOMY         Home Medications    Prior to Admission medications   Medication Sig Start Date End Date Taking? Authorizing Provider  amLODipine (NORVASC) 5 MG tablet Take 5 mg by mouth daily.    Historical Provider, MD  hydrochlorothiazide (HYDRODIURIL) 25 MG tablet Take 1 tablet (25 mg total) by mouth daily. Hold for 1 week then restart 06/04/13   Domenic Polite, MD    Family History Family History  Problem Relation Age of Onset  . Colon cancer Neg Hx   . Esophageal cancer Neg Hx   . Rectal cancer Neg Hx   . Stomach cancer Neg Hx     Social History Social History  Substance Use Topics  . Smoking status: Former Smoker    Packs/day: 0.00    Types: Cigarettes    Quit date: 06/03/2012  . Smokeless tobacco: Never Used  . Alcohol use Yes     Comment: every weekends      Allergies   Patient has no known allergies.   Review of Systems Review of Systems  Constitutional: Negative for chills and fever.  Skin: Positive for wound (tick attached). Negative for rash.     Physical Exam Updated Vital Signs BP (!) 138/92 (BP Location: Right Arm)   Pulse 72   Temp 98 F (36.7 C) (Oral)   Resp 17   SpO2 98%   Physical Exam  Constitutional: He appears well-developed and well-nourished.  HENT:  Head: Normocephalic and atraumatic.  Eyes: Conjunctivae are normal.  Neck: Neck supple.  Cardiovascular: Normal rate and regular rhythm.   No murmur heard. Pulmonary/Chest: Effort normal and breath sounds normal. No respiratory distress. He has no wheezes. He has no rales.  Abdominal: Soft. He exhibits no distension. There is no tenderness. There is no guarding.  Musculoskeletal: He exhibits no edema.  Neurological: He is alert.  Skin: Skin is warm and dry. No rash noted. No erythema.  Tick embedded in the posterior aspect of the left lower leg. No surrounding erythema, warmth, or redness. No rash.   Psychiatric: His behavior is normal.  Nursing note and vitals reviewed.  ED Treatments / Results  Labs (all labs ordered are listed, but only abnormal results are displayed) Labs Reviewed -  No data to display  EKG  EKG Interpretation None       Radiology No results found.  Procedures .Foreign Body Removal Date/Time: 03/31/2017 8:30 AM Performed by: Sherryle Lis, Matteo Banke A Authorized by: Joline Maxcy A  Consent: Verbal consent obtained. Written consent not obtained. Consent given by: patient Intake: left leg. Complexity: simple 1 objects recovered. Objects recovered: tick Post-procedure assessment: foreign body removed Patient tolerance: Patient tolerated the procedure well with no immediate complications   (including critical care time)  Medications Ordered in ED Medications - No data to display   Initial Impression / Assessment and Plan /  ED Course  I have reviewed the triage vital signs and the nursing notes.  Pertinent labs & imaging results that were available during my care of the patient were reviewed by me and considered in my medical decision making (see chart for details).     56 year old male with tick embedded in the posterior aspect of the left lower leg. The entire tick was successfully removed. Discussed return precautions for follow-up including new rash, fever, chills, or warmth, erythema, or swelling around the site of the bite.    Final Clinical Impressions(s) / ED Diagnoses   Final diagnoses:  Tick bite, initial encounter    New Prescriptions Discharge Medication List as of 03/31/2017 10:31 AM       Reyanna Baley A Raesha Coonrod, PA-C 04/01/17 [place], MD 04/03/17 [phone]

## 2017-03-31 NOTE — Discharge Instructions (Signed)
Please return to the Emergency Department if you develop a rash or new symptoms. Please keep your leg clean and dry with soap and water.

## 2017-06-06 ENCOUNTER — Encounter: Payer: Self-pay | Admitting: Gastroenterology

## 2017-06-25 ENCOUNTER — Encounter: Payer: Self-pay | Admitting: Gastroenterology

## 2017-08-27 ENCOUNTER — Ambulatory Visit (AMBULATORY_SURGERY_CENTER): Payer: Self-pay | Admitting: *Deleted

## 2017-08-27 VITALS — Ht 69.0 in | Wt 204.2 lb

## 2017-08-27 DIAGNOSIS — Z86010 Personal history of colonic polyps: Secondary | ICD-10-CM

## 2017-08-27 MED ORDER — NA SULFATE-K SULFATE-MG SULF 17.5-3.13-1.6 GM/177ML PO SOLN: 1.0000 | Freq: Once | ORAL | 0 refills | Status: AC

## 2017-08-27 MED ORDER — NA SULFATE-K SULFATE-MG SULF 17.5-3.13-1.6 GM/177ML PO SOLN
1.0000 | Freq: Once | ORAL | 0 refills | Status: DC
Start: 2017-08-27 — End: 2017-08-27

## 2017-08-27 NOTE — Progress Notes (Signed)
Denies allergies to eggs or soy products. Denies complications with sedation or anesthesia. Denies O2 use. Denies use of diet or weight loss medications.  Emmi instructions not given for colonoscopy, pt does not have Internet or email.

## 2017-09-03 ENCOUNTER — Ambulatory Visit (AMBULATORY_SURGERY_CENTER): Payer: BLUE CROSS/BLUE SHIELD | Admitting: Gastroenterology

## 2017-09-03 ENCOUNTER — Encounter: Payer: Self-pay | Admitting: Gastroenterology

## 2017-09-03 VITALS — BP 116/74 | HR 57 | Temp 98.2°F | Resp 11 | Ht 69.0 in | Wt 204.0 lb

## 2017-09-03 DIAGNOSIS — K635 Polyp of colon: Secondary | ICD-10-CM

## 2017-09-03 DIAGNOSIS — Z86010 Personal history of colonic polyps: Secondary | ICD-10-CM | POA: Diagnosis not present

## 2017-09-03 DIAGNOSIS — D125 Benign neoplasm of sigmoid colon: Secondary | ICD-10-CM

## 2017-09-03 MED ORDER — SODIUM CHLORIDE 0.9 % IV SOLN: 500.0000 mL | INTRAVENOUS | Status: DC

## 2017-09-03 NOTE — Patient Instructions (Addendum)
YOU HAD AN ENDOSCOPIC PROCEDURE TODAY AT THE [facility]:   Refer to the procedure report that was given to you for any specific questions about what was found during the examination.  If the procedure report does not answer your questions, please call your gastroenterologist to clarify.  If you requested that your care partner not be given the details of your procedure findings, then the procedure report has been included in a sealed envelope for you to review at your convenience later.  YOU SHOULD EXPECT: Some feelings of bloating in the abdomen. Passage of more gas than usual.  Walking can help get rid of the air that was put into your GI tract during the procedure and reduce the bloating. If you had a lower endoscopy (such as a colonoscopy or flexible sigmoidoscopy) you may notice spotting of blood in your stool or on the toilet paper. If you underwent a bowel prep for your procedure, you may not have a normal bowel movement for a few days.  Please Note:  You might notice some irritation and congestion in your nose or some drainage.  This is from the oxygen used during your procedure.  There is no need for concern and it should clear up in a day or so.  SYMPTOMS TO REPORT IMMEDIATELY:   Following lower endoscopy (colonoscopy or flexible sigmoidoscopy):  Excessive amounts of blood in the stool  Significant tenderness or worsening of abdominal pains  Swelling of the abdomen that is new, acute  Fever of 100F or higher   Please read all handouts given to you by your recovery nurse.  For urgent or emergent issues, a gastroenterologist can be reached at any hour by calling [phone].   DIET:  We do recommend a small meal at first, but then you may proceed to your regular diet.  Drink plenty of fluids but you should avoid alcoholic beverages for 24 hours.  ACTIVITY:  You should plan to take it easy for the rest of today and you should NOT DRIVE or use heavy machinery until  tomorrow (because of the sedation medicines used during the test).    FOLLOW UP: Our staff will call the number listed on your records the next business day following your procedure to check on you and address any questions or concerns that you may have regarding the information given to you following your procedure. If we do not reach you, we will leave a message.  However, if you are feeling well and you are not experiencing any problems, there is no need to return our call.  We will assume that you have returned to your regular daily activities without incident.  If any biopsies were taken you will be contacted by phone or by letter within the next 1-3 weeks.  Please call us at [phone] if you have not heard about the biopsies in 3 weeks.    SIGNATURES/CONFIDENTIALITY: You and/or your care partner have signed paperwork which will be entered into your electronic medical record.  These signatures attest to the fact that that the information above on your After Visit Summary has been reviewed and is understood.  Full responsibility of the confidentiality of this discharge information lies with you and/or your care-partner.  Thank you for letting us take care of your healthcare needs today. 

## 2017-09-03 NOTE — Progress Notes (Signed)
Spontaneous respirations throughout. VSS. Resting comfortably. To PACU on room air. Report to  RN. 

## 2017-09-03 NOTE — Progress Notes (Signed)
Called to room to assist during endoscopic procedure.  Patient ID and intended procedure confirmed with present staff. Received instructions for my participation in the procedure from the performing physician.  

## 2017-09-03 NOTE — Op Note (Signed)
Dean Brooks Procedure Date: 09/03/2017 9:54 AM MRN: [id number] Endoscopist: [place]. Dean Brooks , MD Age: 56 Referring MD:  Date of Birth: 09-30-1961 Gender: Male Account #: [id number] Procedure:                Colonoscopy Indications:              Surveillance: Personal history of colonic polyps                            (unknown histology) on last colonoscopy 5 years ago Medicines:                Monitored Anesthesia Care Procedure:                Pre-Anesthesia Assessment:                           - Prior to the procedure, a History and Physical                            was performed, and patient medications and                            allergies were reviewed. The patient's tolerance of                            previous anesthesia was also reviewed. The risks                            and benefits of the procedure and the sedation                            options and risks were discussed with the patient.                            All questions were answered, and informed consent                            was obtained. Prior Anticoagulants: The patient has                            taken no previous anticoagulant or antiplatelet                            agents. ASA Grade Assessment: II - A patient with                            mild systemic disease. After reviewing the risks                            and benefits, the patient was deemed in                            satisfactory condition to undergo the procedure.  After obtaining informed consent, the colonoscope                            was passed under direct vision. Throughout the                            procedure, the patient's blood pressure, pulse, and                            oxygen saturations were monitored continuously. The                            Colonoscope was introduced through the anus and                            advanced  to the the cecum, identified by                            appendiceal orifice and ileocecal valve. The                            quality of the bowel preparation was evaluated                            using the BBPS [facility] Bowel Preparation Scale)                            with scores of: Right Colon = 2, Transverse Colon =                            3 and Left Colon = 2. The total BBPS score equals                            7. The bowel preparation used was SUPREP. The                            ileocecal valve, appendiceal orifice, and rectum                            were photographed. Scope In: 10:06:03 AM Scope Out: 10:20:30 AM Scope Withdrawal Time: 0 hours 8 minutes 32 seconds  Total Procedure Duration: 0 hours 14 minutes 27 seconds  Findings:                 The perianal and digital rectal examinations were                            normal.                           A 4 mm polyp was found in the descending colon. The                            polyp was sessile. The polyp was removed  with a                            cold snare. Resection and retrieval were complete.                           Multiple diverticula were found in the entire colon.                           The exam was otherwise without abnormality on                            direct and retroflexion views. Complications:            No immediate complications. Estimated Blood Loss:     Estimated blood loss was minimal. Impression:               - One 4 mm polyp in the descending colon, removed                            with a cold snare. Resected and retrieved.                           - Diverticulosis in the entire examined colon.                           - The examination was otherwise normal on direct                            and retroflexion views. Recommendation:           - Patient has a contact number available for                            emergencies. The signs and symptoms of potential                             delayed complications were discussed with the                            patient. Return to normal activities tomorrow.                            Written discharge instructions were provided to the                            patient.                           - Resume previous diet.                           - Continue present medications.                           - Await pathology results.                           -  Repeat colonoscopy is recommended for                            surveillance. The colonoscopy date will be                            determined after pathology results from today's                            exam become available for review. Dean Brooks L. Dean Carrow, MD 09/03/2017 10:39:19 AM This report has been signed electronically.

## 2017-09-04 ENCOUNTER — Telehealth: Payer: Self-pay | Admitting: *Deleted

## 2017-09-04 NOTE — Telephone Encounter (Signed)
  Follow up Call-  Call back number 09/03/2017 09/03/2017  Post procedure Call Back phone  # [phone] wife number [phone]  Some recent data might be hidden     Patient questions:  Do you have a fever, pain , or abdominal swelling? No. Pain Score  0 *  Have you tolerated food without any problems? Yes  Have you been able to return to your normal activities? Yes.    Do you have any questions about your discharge instructions: Diet   No. Medications  No. Follow up visit  No.  Do you have questions or concerns about your Care? No.  Actions: * If pain score is 4 or above: No action needed, pain <4.

## 2017-09-04 NOTE — Telephone Encounter (Signed)
No answer, left message to call if questions or concerns. 

## 2017-09-09 ENCOUNTER — Encounter: Payer: Self-pay | Admitting: Gastroenterology

## 2018-08-20 ENCOUNTER — Other Ambulatory Visit: Payer: Self-pay

## 2018-08-20 ENCOUNTER — Ambulatory Visit (HOSPITAL_COMMUNITY)
Admission: EM | Admit: 2018-08-20 | Discharge: 2018-08-20 | Disposition: A | Discharge status: Home / Self Care | Payer: BLUE CROSS/BLUE SHIELD | Attending: Emergency Medicine | Admitting: Emergency Medicine

## 2018-08-20 ENCOUNTER — Ambulatory Visit (INDEPENDENT_AMBULATORY_CARE_PROVIDER_SITE_OTHER): Payer: BLUE CROSS/BLUE SHIELD

## 2018-08-20 ENCOUNTER — Encounter (HOSPITAL_COMMUNITY): Payer: Self-pay | Admitting: Emergency Medicine

## 2018-08-20 DIAGNOSIS — S39012D Strain of muscle, fascia and tendon of lower back, subsequent encounter: Secondary | ICD-10-CM

## 2018-08-20 DIAGNOSIS — M545 Low back pain, unspecified: Secondary | ICD-10-CM

## 2018-08-20 DIAGNOSIS — G8929 Other chronic pain: Secondary | ICD-10-CM

## 2018-08-20 DIAGNOSIS — T148XXA Other injury of unspecified body region, initial encounter: Secondary | ICD-10-CM

## 2018-08-20 MED ORDER — KETOROLAC TROMETHAMINE 30 MG/ML IJ SOLN
INTRAMUSCULAR | Status: AC
  Filled 2018-08-20: qty 1

## 2018-08-20 MED ORDER — KETOROLAC TROMETHAMINE 30 MG/ML IJ SOLN
30.0000 mg | Freq: Once | INTRAMUSCULAR | Status: AC
  Administered 2018-08-20: 30 mg via INTRAMUSCULAR

## 2018-08-20 NOTE — ED Notes (Signed)
Bed: UC01 Expected date:  Expected time:  Means of arrival:  Comments: Appointments 

## 2018-08-20 NOTE — ED Provider Notes (Signed)
[facility]    CSN: [id number] Arrival date & time: 08/20/18  1807     History   Chief Complaint Chief Complaint  Patient presents with  . Back Pain    HPI Dean Brooks is a 57 y.o. male.   Pt states that he has chronic back pain for the past 20 yrs now. States 3 days ago his wife had truck in [place] and he was trying to push it from the back and now has had extreme lower back pain with no relief from NSAIDS. Denies any urinary incon. No bowel problems. Able to walk but with some pain. States that he has not been able to rest due to work .      Past Medical History:  Diagnosis Date  . Hypertension     Patient Active Problem List   Diagnosis Date Noted  . AKI (acute kidney injury) ([place]) 06/03/2013  . Dehydration 06/03/2013  . Hypercalcemia 06/03/2013  . Elevated glucose 06/03/2013  . Hypertension 06/03/2013  . Alcohol abuse, daily use 06/03/2013    Past Surgical History:  Procedure Laterality Date  . COLONOSCOPY  2013  . TONSILLECTOMY         Home Medications    Prior to Admission medications   Medication Sig Start Date End Date Taking? Authorizing Provider  METFORMIN HCL PO Take by mouth.   Yes [provider]  SIMVASTATIN PO Take by mouth.   Yes [provider]  amLODipine (NORVASC) 5 MG tablet Take 5 mg by mouth daily.    [provider]  hydrochlorothiazide (HYDRODIURIL) 25 MG tablet Take 1 tablet (25 mg total) by mouth daily. Hold for 1 week then restart 06/04/13   Domenic Polite, MD  losartan (COZAAR) 100 MG tablet Take by mouth. 08/06/17   [provider]    Family History Family History  Problem Relation Age of Onset  . Colon cancer Neg Hx   . Esophageal cancer Neg Hx   . Rectal cancer Neg Hx   . Stomach cancer Neg Hx     Social History Social History   Tobacco Use  . Smoking status: Former Smoker    Packs/day: 0.00    Types: Cigarettes    Last attempt to quit: 06/03/2012    Years since  quitting: 6.2  . Smokeless tobacco: Never Used  Substance Use Topics  . Alcohol use: Yes    Comment: every weekends  . Drug use: No     Allergies   Lisinopril   Review of Systems Review of Systems  Respiratory: Negative.   Cardiovascular: Negative.   Gastrointestinal: Negative.   Musculoskeletal: Positive for back pain.  Skin: Negative.   Neurological: Negative.      Physical Exam Triage Vital Signs ED Triage Vitals  Enc Vitals Group     BP 08/20/18 1834 (!) 157/97     Pulse Rate 08/20/18 1834 62     Resp 08/20/18 1834 18     Temp 08/20/18 1834 98.7 F (37.1 C)     Temp Source 08/20/18 1834 Oral     SpO2 08/20/18 1834 96 %     Weight --      Height --      Head Circumference --      Peak Flow --      Pain Score 08/20/18 1831 10     Pain Loc --      Pain Edu? --      Excl. in [place]? --  No data found.  Updated Vital Signs BP (!) 157/97 (BP Location: Left Arm)   Pulse 62   Temp 98.7 F (37.1 C) (Oral)   Resp 18   SpO2 96%   Visual Acuity Right Eye Distance:   Left Eye Distance:   Bilateral Distance:    Right Eye Near:   Left Eye Near:    Bilateral Near:     Physical Exam  Cardiovascular: Normal rate and regular rhythm.  Pulmonary/Chest: Effort normal and breath sounds normal.  Abdominal: Soft.  Musculoskeletal: He exhibits tenderness.  Lumbar area tenderness on palpation, no cv tenderness, able to flex and touch toes.   Neurological: He is alert.  Skin: Skin is warm. Capillary refill takes less than 2 seconds.     UC Treatments / Results  Labs (all labs ordered are listed, but only abnormal results are displayed) Labs Reviewed - No data to display  EKG None  Radiology Dg Lumbar Spine Complete  Result Date: 08/20/2018 CLINICAL DATA:  Low back pain for 2 weeks EXAM: LUMBAR SPINE - COMPLETE 4+ VIEW COMPARISON:  None. FINDINGS: Anatomic alignment. No vertebral compression. Moderate narrowing of the L4-5 disc with vacuum. Anterior  osteophytes in the lower lumbar spine. IMPRESSION: No acute bony pathology.  Degenerative change. Electronically Signed   By: Marybelle Killings M.D.   On: 08/20/2018 19:38    Procedures Procedures (including critical care time)  Medications Ordered in UC Medications  ketorolac (TORADOL) 30 MG/ML injection 30 mg (30 mg Intramuscular Given 08/20/18 1913)    Initial Impression / Assessment and Plan / UC Course  I have reviewed the triage vital signs and the nursing notes.  Pertinent labs & imaging results that were available during my care of the patient were reviewed by me and considered in my medical decision making (see chart for details).     Apply warm compress  Take NSAIDS or tylenol for pain  Apply warm compress  Take NSAIDS or tylenol for pain  Rest  Take medications when not driving If pain is not better in 1 week call to follow up with ortho for further test  If you have sx or loss of urine you will need to go to the emergency room   Final Clinical Impressions(s) / UC Diagnoses   Final diagnoses:  Strain of lumbar region, subsequent encounter  Chronic midline low back pain without sciatica  Muscle strain     Discharge Instructions     Apply warm compress  Take NSAIDS or tylenol for pain  Rest  Take medications when not driving If pain is not better in 1 week call to follow up with ortho for further test  If you have sx or loss of urine you will need to go to the emergency room     ED Prescriptions    None     Controlled Substance Prescriptions [place] Controlled Substance Registry consulted? Not Applicable   Marney Setting, NP 08/20/18 1942

## 2018-08-20 NOTE — ED Triage Notes (Addendum)
Back pain for 9 days.  Pain is in lower back and right flank.  Denies pain with urination.  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

## 2018-08-20 NOTE — Discharge Instructions (Addendum)
Apply warm compress  Take NSAIDS or tylenol for pain  Rest  Take medications when not driving If pain is not better in 1 week call to follow up with ortho for further test  If you have sx or loss of urine you will need to go to the emergency room

## 2018-08-21 ENCOUNTER — Telehealth (HOSPITAL_COMMUNITY): Payer: Self-pay | Admitting: Emergency Medicine

## 2018-08-21 MED ORDER — CYCLOBENZAPRINE HCL 10 MG PO TABS: 10.0000 mg | ORAL_TABLET | Freq: Every day | ORAL | 0 refills | Status: DC

## 2020-07-11 ENCOUNTER — Other Ambulatory Visit: Payer: Self-pay

## 2020-07-11 ENCOUNTER — Ambulatory Visit (HOSPITAL_COMMUNITY)
Admission: EM | Admit: 2020-07-11 | Discharge: 2020-07-11 | Disposition: A | Discharge status: Home / Self Care | Payer: BC Managed Care – PPO | Attending: Family Medicine | Admitting: Family Medicine

## 2020-07-11 ENCOUNTER — Encounter (HOSPITAL_COMMUNITY): Payer: Self-pay | Admitting: Emergency Medicine

## 2020-07-11 DIAGNOSIS — M5441 Lumbago with sciatica, right side: Secondary | ICD-10-CM | POA: Diagnosis not present

## 2020-07-11 MED ORDER — KETOROLAC TROMETHAMINE 30 MG/ML IJ SOLN
INTRAMUSCULAR | Status: AC
  Filled 2020-07-11: qty 1

## 2020-07-11 MED ORDER — KETOROLAC TROMETHAMINE 30 MG/ML IJ SOLN
30.0000 mg | Freq: Once | INTRAMUSCULAR | Status: AC
  Administered 2020-07-11: 30 mg via INTRAMUSCULAR

## 2020-07-11 MED ORDER — METHYLPREDNISOLONE 4 MG PO TBPK: ORAL_TABLET | ORAL | 0 refills | Status: DC

## 2020-07-11 MED ORDER — CYCLOBENZAPRINE HCL 10 MG PO TABS: 10.0000 mg | ORAL_TABLET | Freq: Every day | ORAL | 0 refills | Status: DC

## 2020-07-11 NOTE — ED Triage Notes (Signed)
Patient presents to urgent care today with symptoms of lower back pain. This is a chronic problem, he states he was here 2 years ago and "needs the same shot". They have tried an old prescription of muscle relaxers from 2019 with no relief of symptoms.

## 2020-07-11 NOTE — ED Provider Notes (Signed)
[facility]    CSN: [id number] Arrival date & time: 07/11/20  [place]      History   Chief Complaint Chief Complaint  Patient presents with  . Back Pain    HPI Dean Brooks is a 59 y.o. male.   HPI  Patient suffered from low back pain intermittently over the years He occasionally have flares where his back is more painful Currently he has had increased low back pain for the last couple of days He tried some old muscle relaxers, and ibuprofen for pain.  He is here requesting "the shot" that he got last time.  Chart review indicates that he did get a shot of Toradol. Currently having pain in the right low back.  Some pain into his right buttock. No numbness or weakness.  No bowel or bladder complaint No recent trauma or injury  Past Medical History:  Diagnosis Date  . Hypertension     Patient Active Problem List   Diagnosis Date Noted  . AKI (acute kidney injury) ([place]) 06/03/2013  . Dehydration 06/03/2013  . Hypercalcemia 06/03/2013  . Elevated glucose 06/03/2013  . Hypertension 06/03/2013  . Alcohol abuse, daily use 06/03/2013    Past Surgical History:  Procedure Laterality Date  . COLONOSCOPY  2013  . TONSILLECTOMY         Home Medications    Prior to Admission medications   Medication Sig Start Date End Date Taking? Authorizing Provider  amLODipine (NORVASC) 5 MG tablet Take 5 mg by mouth daily.    [provider]  cyclobenzaprine (FLEXERIL) 10 MG tablet Take 1 tablet (10 mg total) by mouth at bedtime. 07/11/20   Raylene Everts, MD  hydrochlorothiazide (HYDRODIURIL) 25 MG tablet Take 1 tablet (25 mg total) by mouth daily. Hold for 1 week then restart 06/04/13   Domenic Polite, MD  losartan (COZAAR) 100 MG tablet Take by mouth. 08/06/17   [provider]  METFORMIN HCL PO Take by mouth.    [provider]  methylPREDNISolone (MEDROL DOSEPAK) 4 MG TBPK tablet tad 07/11/20   Raylene Everts, MD  SIMVASTATIN PO Take  by mouth.    [provider]    Family History Family History  Problem Relation Age of Onset  . Colon cancer Neg Hx   . Esophageal cancer Neg Hx   . Rectal cancer Neg Hx   . Stomach cancer Neg Hx     Social History Social History   Tobacco Use  . Smoking status: Former Smoker    Packs/day: 0.00    Types: Cigarettes    Quit date: 06/03/2012    Years since quitting: 8.1  . Smokeless tobacco: Never Used  Vaping Use  . Vaping Use: Never used  Substance Use Topics  . Alcohol use: Yes    Comment: every weekends  . Drug use: No     Allergies   Lisinopril   Review of Systems Review of Systems See HPI  Physical Exam Triage Vital Signs ED Triage Vitals  Enc Vitals Group     BP 07/11/20 1949 (!) 146/78     Pulse Rate 07/11/20 1949 68     Resp 07/11/20 1949 15     Temp 07/11/20 1949 98.3 F (36.8 C)     Temp Source 07/11/20 1949 Oral     SpO2 07/11/20 1949 98 %     Weight 07/11/20 2021 180 lb (81.6 kg)     Height 07/11/20 2021 5\' 9"  (1.753 m)  Head Circumference --      Peak Flow --      Pain Score 07/11/20 1948 10     Pain Loc --      Pain Edu? --      Excl. in [place]? --    No data found.  Updated Vital Signs BP (!) 146/78 (BP Location: Right Arm)   Pulse 68   Temp 98.3 F (36.8 C) (Oral)   Resp 15   Ht 5\' 9"  (1.753 m)   Wt 81.6 kg   SpO2 98%   BMI 26.58 kg/m      Physical Exam Constitutional:      General: He is not in acute distress.    Appearance: He is well-developed and normal weight.  HENT:     Head: Normocephalic and atraumatic.     Mouth/Throat:     Comments: Mask is in place Eyes:     Conjunctiva/sclera: Conjunctivae normal.     Pupils: Pupils are equal, round, and reactive to light.  Cardiovascular:     Rate and Rhythm: Normal rate.  Pulmonary:     Effort: Pulmonary effort is normal. No respiratory distress.  Abdominal:     General: There is no distension.     Palpations: Abdomen is soft.  Musculoskeletal:         General: Normal range of motion.     Cervical back: Normal range of motion.     Comments: Mild tenderness to deep palpation in the right SI.  Mild increased muscle tone on the right lumbar.  Slow but full range of motion.  Strength sensation range of motion reflexes normal lower extremities.  Straight leg raise is positive on the right full leg extension for increased buttock pain  Skin:    General: Skin is warm and dry.  Neurological:     General: No focal deficit present.     Mental Status: He is alert.     Motor: No weakness.     Coordination: Coordination normal.     Gait: Gait normal.     Deep Tendon Reflexes: Reflexes normal.  Psychiatric:        Mood and Affect: Mood normal.        Behavior: Behavior normal.      UC Treatments / Results  Labs (all labs ordered are listed, but only abnormal results are displayed) Labs Reviewed - No data to display  EKG   Radiology No results found.  Procedures Procedures (including critical care time)  Medications Ordered in UC Medications  ketorolac (TORADOL) 30 MG/ML injection 30 mg (30 mg Intramuscular Given 07/11/20 2045)    Initial Impression / Assessment and Plan / UC Course  I have reviewed the triage vital signs and the nursing notes.  Pertinent labs & imaging results that were available during my care of the patient were reviewed by me and considered in my medical decision making (see chart for details).     Mechanical back pain.  Discussed conservative treatment Final Clinical Impressions(s) / UC Diagnoses   Final diagnoses:  Acute right-sided low back pain with right-sided sciatica     Discharge Instructions     Take Medrol Dosepak as prescribed Take all of day 1 today, 3 now and then 3 at bedtime Tomorrow take on schedule Take muscle relaxer as needed Be careful with bending and lifting activities   ED Prescriptions    Medication Sig Dispense Auth. Provider   cyclobenzaprine (FLEXERIL) 10 MG tablet Take  1 tablet (10  mg total) by mouth at bedtime. 10 tablet Raylene Everts, MD   methylPREDNISolone (MEDROL DOSEPAK) 4 MG TBPK tablet tad 21 tablet Raylene Everts, MD     PDMP not reviewed this encounter.   Raylene Everts, MD 07/11/20 2236

## 2020-07-11 NOTE — Discharge Instructions (Signed)
Take Medrol Dosepak as prescribed Take all of day 1 today, 3 now and then 3 at bedtime Tomorrow take on schedule Take muscle relaxer as needed Be careful with bending and lifting activities

## 2020-09-03 IMAGING — DX DG LUMBAR SPINE COMPLETE 4+V
5 series · 5 of 5 positions shown · non-contrast
Comparison: None.

CLINICAL DATA: Low back pain for 2 weeks

EXAM:
LUMBAR SPINE - COMPLETE 4+ VIEW

[l-spine ap]
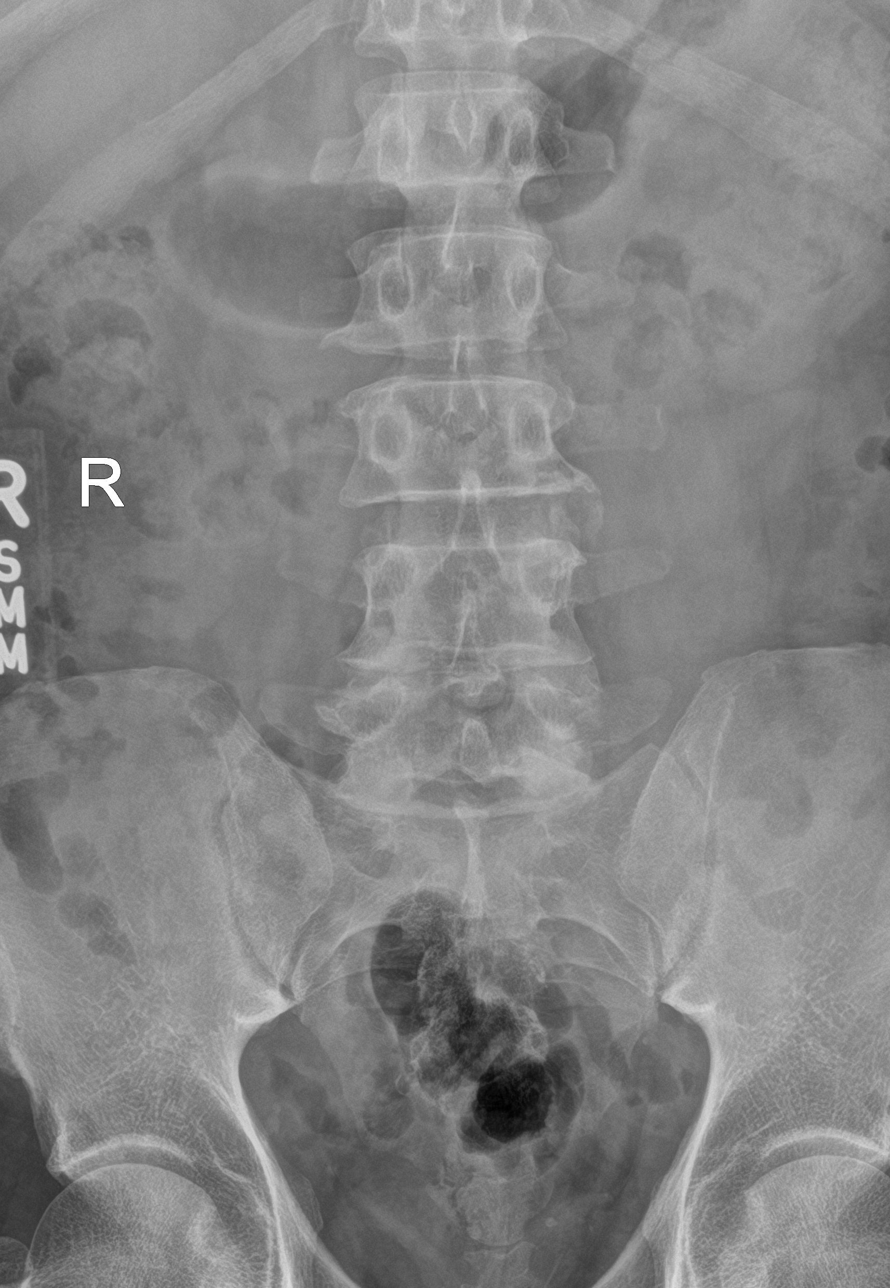

[l-spine obl (1 of 2)]
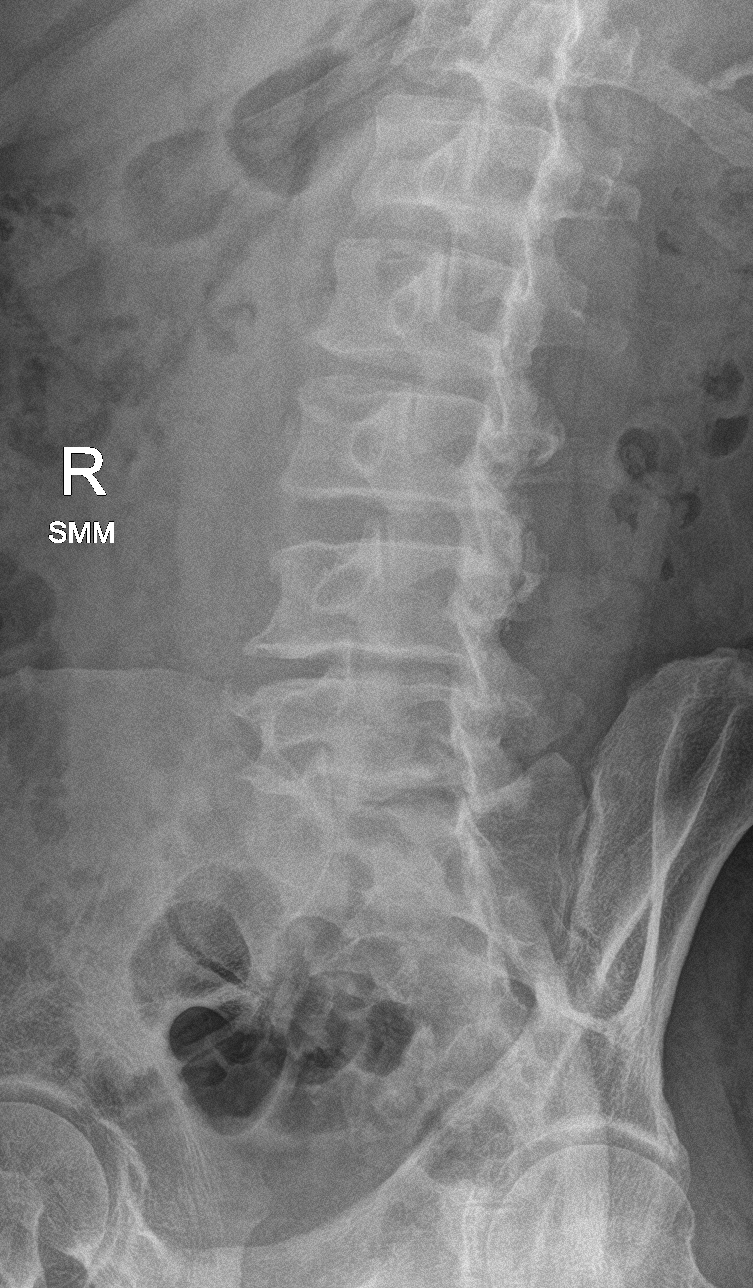

[l-spine obl (2 of 2)]
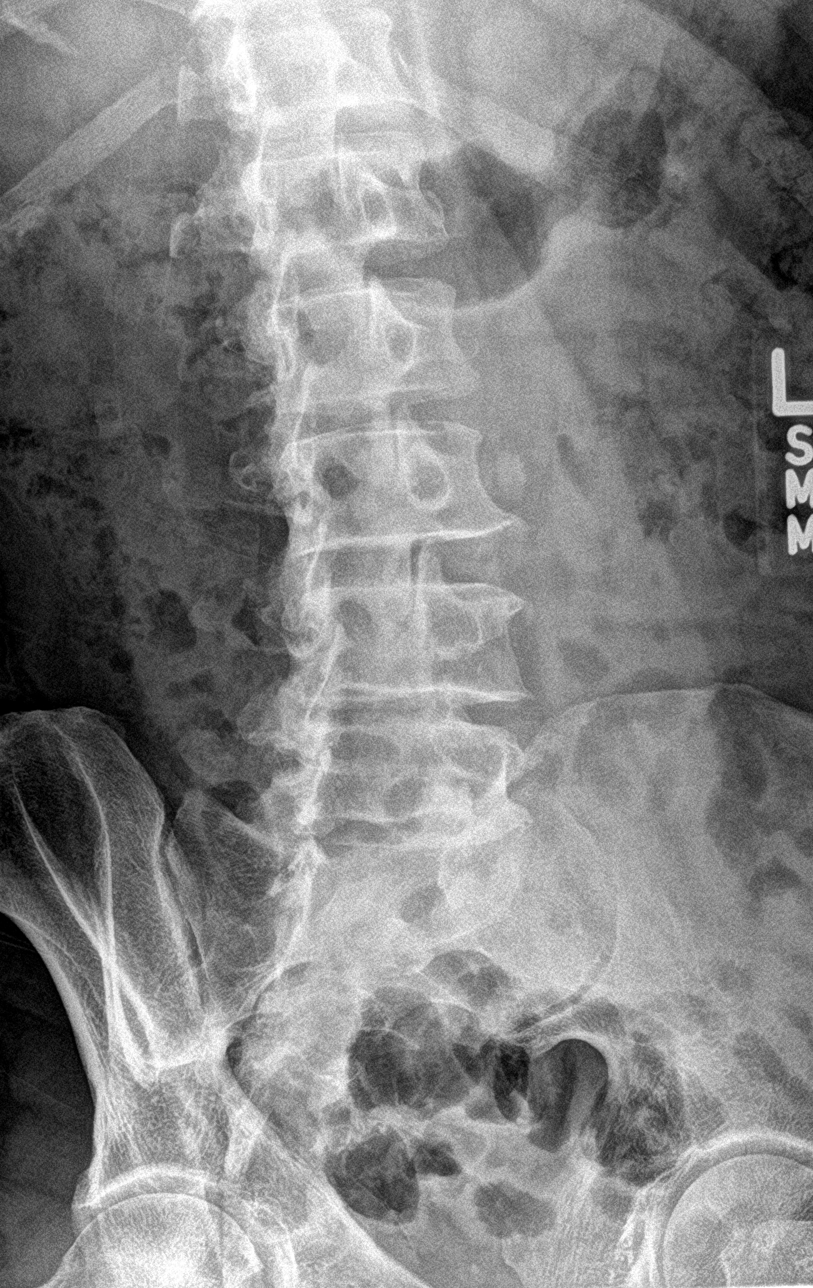

[l-spine lat]
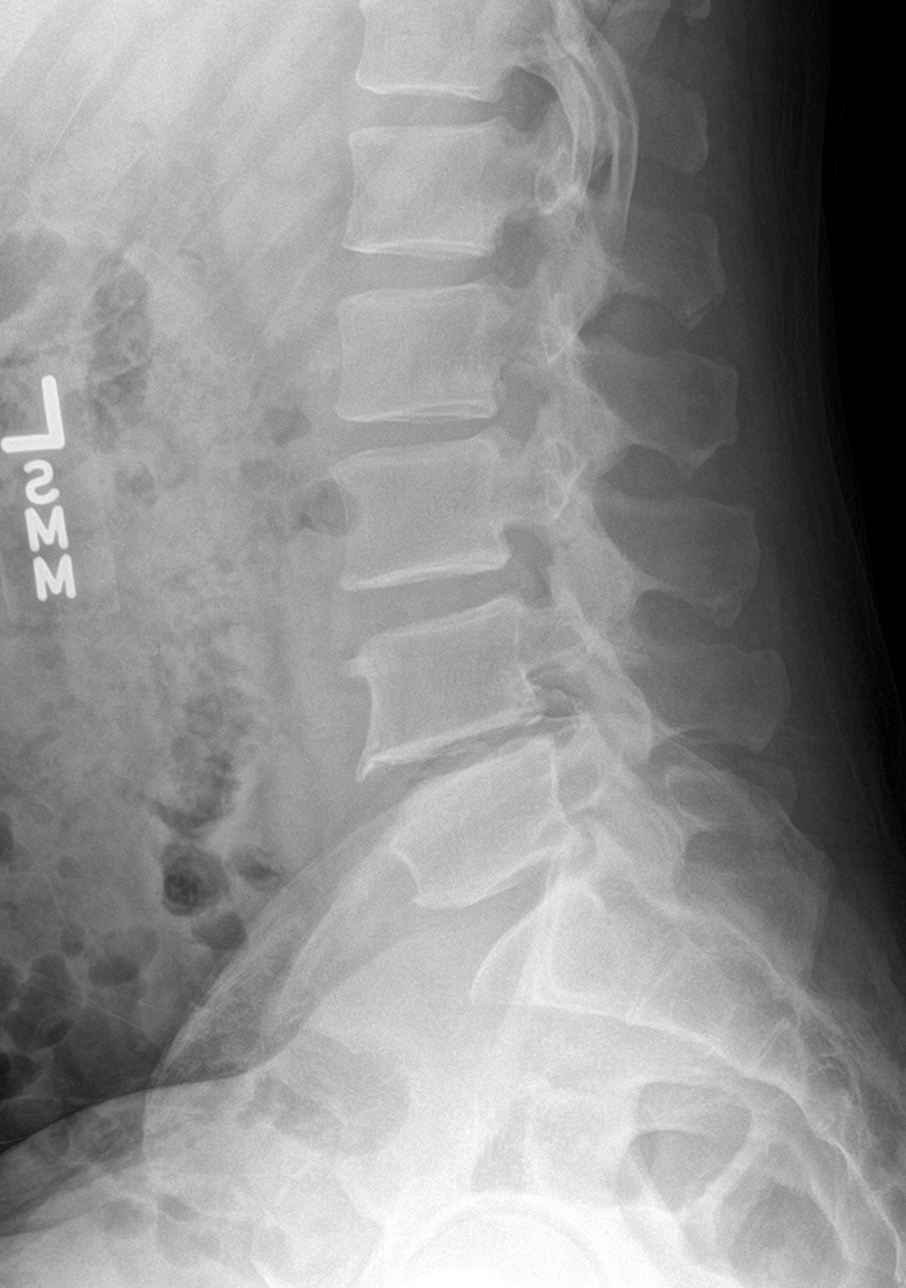

[l-spine spot]
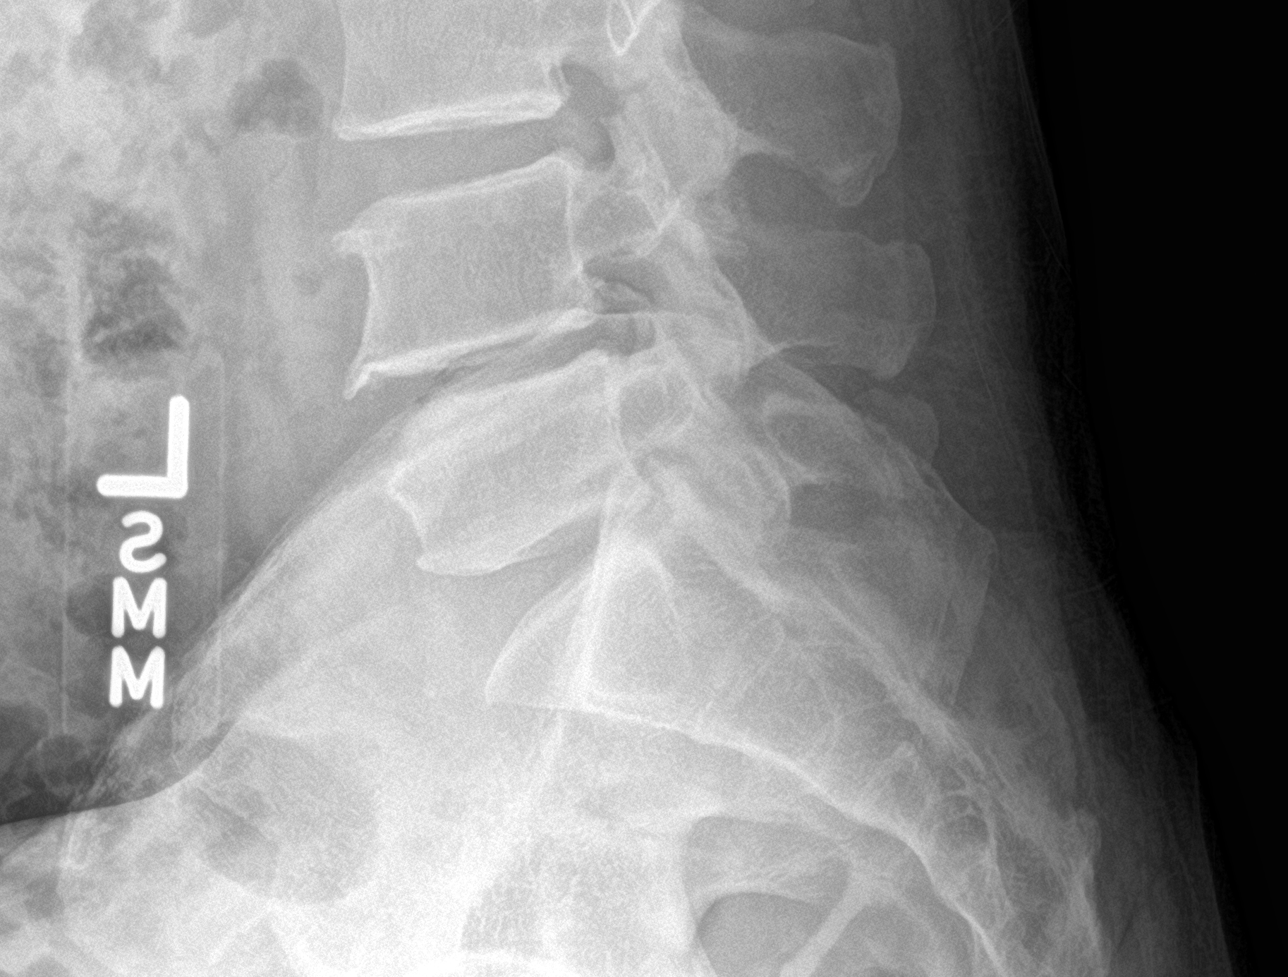

[5 of 5 positions shown; findings below may reference images not displayed]

FINDINGS: Anatomic alignment. No vertebral compression. Moderate narrowing of
the L4-5 disc with vacuum. Anterior osteophytes in the lower lumbar
spine.
IMPRESSION: No acute bony pathology.  Degenerative change.

## 2020-09-23 ENCOUNTER — Encounter (HOSPITAL_COMMUNITY): Payer: Self-pay

## 2020-09-23 ENCOUNTER — Other Ambulatory Visit: Payer: Self-pay

## 2020-09-23 ENCOUNTER — Ambulatory Visit (HOSPITAL_COMMUNITY)
Admission: EM | Admit: 2020-09-23 | Discharge: 2020-09-23 | Disposition: A | Discharge status: Home / Self Care | Payer: BC Managed Care – PPO | Attending: Family Medicine | Admitting: Family Medicine

## 2020-09-23 DIAGNOSIS — R21 Rash and other nonspecific skin eruption: Secondary | ICD-10-CM | POA: Diagnosis not present

## 2020-09-23 MED ORDER — KETOCONAZOLE 2 % EX CREA: 1.0000 "application " | TOPICAL_CREAM | Freq: Two times a day (BID) | CUTANEOUS | 0 refills | Status: DC | PRN

## 2020-09-23 MED ORDER — TRIAMCINOLONE ACETONIDE 0.1 % EX CREA: 1.0000 "application " | TOPICAL_CREAM | Freq: Two times a day (BID) | CUTANEOUS | 0 refills | Status: DC

## 2020-09-23 NOTE — ED Triage Notes (Signed)
Pt presents today with a rash on his back that has been there for about 3 weeks or longer. States that it does itch and he contributes it to the change in his diabetic medication metformin because of the dose change. Has been on the medication for a while but the medication was increased about the time his rash started. Has not tried anything OTC to help.

## 2020-09-23 NOTE — Discharge Instructions (Addendum)
Try the ketoconazole cream first for about a week, if your rash does not improve you may try the triamcinolone cream, which is a steroid cream.

## 2020-09-24 NOTE — ED Provider Notes (Signed)
[facility]    CSN: [id number] Arrival date & time: 09/23/20  1729      History   Chief Complaint Chief Complaint  Patient presents with  . Rash    HPI Dean Brooks is a 59 y.o. male.   Here today for an itchy rash that started on his low back and is slowly extending upward to mid back x 3 weeks. He thinks it may be related to a dose increase in his metformin, cannot think of any other new exposures he would have had. No pain, fever, drainage, body aches, headaches. Has not tried anything other than rubbing alcohol to the area.      Past Medical History:  Diagnosis Date  . Hypertension     Patient Active Problem List   Diagnosis Date Noted  . AKI (acute kidney injury) ([place]) 06/03/2013  . Dehydration 06/03/2013  . Hypercalcemia 06/03/2013  . Elevated glucose 06/03/2013  . Hypertension 06/03/2013  . Alcohol abuse, daily use 06/03/2013    Past Surgical History:  Procedure Laterality Date  . COLONOSCOPY  2013  . TONSILLECTOMY         Home Medications    Prior to Admission medications   Medication Sig Start Date End Date Taking? Authorizing Provider  amLODipine (NORVASC) 5 MG tablet Take 5 mg by mouth daily.   Yes [provider]  cyclobenzaprine (FLEXERIL) 10 MG tablet Take 1 tablet (10 mg total) by mouth at bedtime. 07/11/20  Yes Raylene Everts, MD  hydrochlorothiazide (HYDRODIURIL) 25 MG tablet Take 1 tablet (25 mg total) by mouth daily. Hold for 1 week then restart 06/04/13  Yes Domenic Polite, MD  losartan (COZAAR) 100 MG tablet Take by mouth. 08/06/17  Yes [provider]  METFORMIN HCL PO Take by mouth.   Yes [provider]  SIMVASTATIN PO Take by mouth.   Yes [provider]  ketoconazole (NIZORAL) 2 % cream Apply 1 application topically 2 (two) times daily as needed for irritation. 09/23/20   Volney American, PA-C  methylPREDNISolone (MEDROL DOSEPAK) 4 MG TBPK tablet tad 07/11/20   Raylene Everts, MD  triamcinolone cream (KENALOG) 0.1 % Apply 1 application topically 2 (two) times daily. Can start this if no benefit from ketoconazole cream 09/23/20   Volney American, PA-C    Family History Family History  Problem Relation Age of Onset  . Colon cancer Neg Hx   . Esophageal cancer Neg Hx   . Rectal cancer Neg Hx   . Stomach cancer Neg Hx     Social History Social History   Tobacco Use  . Smoking status: Former Smoker    Packs/day: 0.00    Types: Cigarettes    Quit date: 06/03/2012    Years since quitting: 8.3  . Smokeless tobacco: Never Used  Vaping Use  . Vaping Use: Never used  Substance Use Topics  . Alcohol use: Yes    Comment: every weekends  . Drug use: No     Allergies   Lisinopril   Review of Systems Review of Systems PER HPI   Physical Exam Triage Vital Signs ED Triage Vitals  Enc Vitals Group     BP 09/23/20 1822 136/86     Pulse Rate 09/23/20 1822 63     Resp 09/23/20 1822 16     Temp 09/23/20 1822 98.9 F (37.2 C)     Temp src --      SpO2 09/23/20 1822 97 %  Weight --      Height --      Head Circumference --      Peak Flow --      Pain Score 09/23/20 1824 0     Pain Loc --      Pain Edu? --      Excl. in [place]? --    No data found.  Updated Vital Signs BP 136/86 (BP Location: Right Arm)   Pulse 63   Temp 98.9 F (37.2 C)   Resp 16   SpO2 97%   Visual Acuity Right Eye Distance:   Left Eye Distance:   Bilateral Distance:    Right Eye Near:   Left Eye Near:    Bilateral Near:     Physical Exam Vitals and nursing note reviewed.  Constitutional:      Appearance: Normal appearance.  HENT:     Head: Atraumatic.  Eyes:     Extraocular Movements: Extraocular movements intact.     Conjunctiva/sclera: Conjunctivae normal.  Cardiovascular:     Rate and Rhythm: Normal rate and regular rhythm.  Pulmonary:     Effort: Pulmonary effort is normal.     Breath sounds: Normal breath sounds.  Musculoskeletal:         General: Normal range of motion.     Cervical back: Normal range of motion and neck supple.  Skin:    General: Skin is warm and dry.     Findings: Rash (hyperpigmented macular rash in patches on low back, no peeling, raised borders, papules) present.  Neurological:     General: No focal deficit present.     Mental Status: He is oriented to person, place, and time.  Psychiatric:        Mood and Affect: Mood normal.        Thought Content: Thought content normal.        Judgment: Judgment normal.      UC Treatments / Results  Labs (all labs ordered are listed, but only abnormal results are displayed) Labs Reviewed - No data to display  EKG   Radiology No results found.  Procedures Procedures (including critical care time)  Medications Ordered in UC Medications - No data to display  Initial Impression / Assessment and Plan / UC Course  I have reviewed the triage vital signs and the nursing notes.  Pertinent labs & imaging results that were available during my care of the patient were reviewed by me and considered in my medical decision making (see chart for details).     Suspect tinea versicolor, will trial ketoconazole cream initially x 1 week and if no benefit may switch to triamcinolone in case inflammatory. F/u if worsening or not resolving with either of these. Avoid scented products to r/o contact allergy though does not appear allergic  Final Clinical Impressions(s) / UC Diagnoses   Final diagnoses:  Rash     Discharge Instructions     Try the ketoconazole cream first for about a week, if your rash does not improve you may try the triamcinolone cream, which is a steroid cream.     ED Prescriptions    Medication Sig Dispense Auth. Provider   ketoconazole (NIZORAL) 2 % cream Apply 1 application topically 2 (two) times daily as needed for irritation. 60 g Volney American, [place]   triamcinolone cream (KENALOG) 0.1 % Apply 1 application topically 2 (two)  times daily. Can start this if no benefit from ketoconazole cream 30 g Volney American,  PA-C     PDMP not reviewed this encounter.   Volney American, [place] 09/24/20 1038

## 2020-12-22 ENCOUNTER — Other Ambulatory Visit: Payer: Self-pay

## 2020-12-22 ENCOUNTER — Emergency Department (HOSPITAL_COMMUNITY)
Admission: EM | Admit: 2020-12-22 | Discharge: 2020-12-22 | Disposition: A | Discharge status: Home / Self Care | Payer: BC Managed Care – PPO | Attending: Emergency Medicine | Admitting: Emergency Medicine

## 2020-12-22 ENCOUNTER — Encounter (HOSPITAL_COMMUNITY): Payer: Self-pay

## 2020-12-22 DIAGNOSIS — M62838 Other muscle spasm: Secondary | ICD-10-CM | POA: Diagnosis not present

## 2020-12-22 DIAGNOSIS — Z79899 Other long term (current) drug therapy: Secondary | ICD-10-CM | POA: Insufficient documentation

## 2020-12-22 DIAGNOSIS — I10 Essential (primary) hypertension: Secondary | ICD-10-CM | POA: Insufficient documentation

## 2020-12-22 DIAGNOSIS — Z87891 Personal history of nicotine dependence: Secondary | ICD-10-CM | POA: Insufficient documentation

## 2020-12-22 DIAGNOSIS — M542 Cervicalgia: Secondary | ICD-10-CM | POA: Diagnosis present

## 2020-12-22 MED ORDER — CYCLOBENZAPRINE HCL 10 MG PO TABS: 10.0000 mg | ORAL_TABLET | Freq: Three times a day (TID) | ORAL | 0 refills | Status: DC | PRN

## 2020-12-22 MED ORDER — NAPROXEN 375 MG PO TABS: 375.0000 mg | ORAL_TABLET | Freq: Two times a day (BID) | ORAL | 0 refills | Status: AC

## 2020-12-22 MED ORDER — KETOROLAC TROMETHAMINE 60 MG/2ML IM SOLN
30.0000 mg | Freq: Once | INTRAMUSCULAR | Status: AC
  Administered 2020-12-22: 30 mg via INTRAMUSCULAR
  Filled 2020-12-22: qty 2

## 2020-12-22 NOTE — Discharge Instructions (Addendum)
If you develop fever, severe headache, vomiting, vision changes, chest pain, shortness of breath, weakness or numbness in your arms or legs, or any other new/concerning symptoms then return to the ER for evaluation.  You are being prescribed Naproxen. Do not take Ibuprofen/Advil/Aleve/Motrin/Goody Powders/BC powders/Meloxicam/Diclofenac/Indomethacin and other Nonsteroidal anti-inflammatory medications    You may still take Tylenol and are encouraged to do so.

## 2020-12-22 NOTE — ED Triage Notes (Signed)
Pt reports neck pain/ headache since yesterday at work. Pt sts pain intensifies if he turns in a certain direction.

## 2020-12-22 NOTE — ED Provider Notes (Signed)
[facility] DEPT Provider Note   CSN: [id number] Arrival date & time: 12/22/20  [id number]     History Chief Complaint  Patient presents with  . Headache  . Neck Pain    Dean Brooks is a 60 y.o. male.  HPI 60 year old male presents with right-sided neck pain.  Woke up with some neck pain at the base of his skull yesterday morning.  Was not too bad.  However when he woke up this morning his neck is hurting worse and radiating to his head.  He has a hard time turning his neck, especially to the right.  No fevers, vision changes, weakness/numbness in his extremities.  No trauma.  Has not taken anything for this.   Past Medical History:  Diagnosis Date  . Hypertension     Patient Active Problem List   Diagnosis Date Noted  . AKI (acute kidney injury) ([place]) 06/03/2013  . Dehydration 06/03/2013  . Hypercalcemia 06/03/2013  . Elevated glucose 06/03/2013  . Hypertension 06/03/2013  . Alcohol abuse, daily use 06/03/2013    Past Surgical History:  Procedure Laterality Date  . COLONOSCOPY  2013  . TONSILLECTOMY         Family History  Problem Relation Age of Onset  . Colon cancer Neg Hx   . Esophageal cancer Neg Hx   . Rectal cancer Neg Hx   . Stomach cancer Neg Hx     Social History   Tobacco Use  . Smoking status: Former Smoker    Packs/day: 0.00    Types: Cigarettes    Quit date: 06/03/2012    Years since quitting: 8.5  . Smokeless tobacco: Never Used  Vaping Use  . Vaping Use: Never used  Substance Use Topics  . Alcohol use: Yes    Comment: every weekends  . Drug use: No    Home Medications Prior to Admission medications   Medication Sig Start Date End Date Taking? Authorizing Provider  cyclobenzaprine (FLEXERIL) 10 MG tablet Take 1 tablet (10 mg total) by mouth 3 (three) times daily as needed for muscle spasms. 12/22/20  Yes Sherwood Gambler, MD  naproxen (NAPROSYN) 375 MG tablet Take 1 tablet (375 mg total) by mouth 2 (two)  times daily with a meal. 12/22/20  Yes Sherwood Gambler, MD  amLODipine (NORVASC) 5 MG tablet Take 5 mg by mouth daily.    [provider]  hydrochlorothiazide (HYDRODIURIL) 25 MG tablet Take 1 tablet (25 mg total) by mouth daily. Hold for 1 week then restart 06/04/13   Domenic Polite, MD  ketoconazole (NIZORAL) 2 % cream Apply 1 application topically 2 (two) times daily as needed for irritation. 09/23/20   Volney American, PA-C  losartan (COZAAR) 100 MG tablet Take by mouth. 08/06/17   [provider]  METFORMIN HCL PO Take by mouth.    [provider]  methylPREDNISolone (MEDROL DOSEPAK) 4 MG TBPK tablet tad 07/11/20   Raylene Everts, MD  SIMVASTATIN PO Take by mouth.    [provider]  triamcinolone cream (KENALOG) 0.1 % Apply 1 application topically 2 (two) times daily. Can start this if no benefit from ketoconazole cream 09/23/20   Volney American, PA-C    Allergies    Lisinopril  Review of Systems   Review of Systems  Constitutional: Negative for fever.  Eyes: Negative for visual disturbance.  Cardiovascular: Negative for chest pain.  Musculoskeletal: Positive for neck pain. Negative for neck stiffness.  Neurological: Positive for headaches. Negative for  weakness and numbness.  All other systems reviewed and are negative.   Physical Exam Updated Vital Signs BP (!) 166/97   Pulse 66   Temp 98.4 F (36.9 C) (Oral)   Resp 19   Ht 5\' 9"  (1.753 m)   Wt 86.2 kg   SpO2 95%   BMI 28.06 kg/m   Physical Exam Vitals and nursing note reviewed.  Constitutional:      General: He is not in acute distress.    Appearance: He is well-developed and well-nourished. He is not ill-appearing or diaphoretic.  HENT:     Head: Normocephalic and atraumatic.     Right Ear: External ear normal.     Left Ear: External ear normal.     Nose: Nose normal.  Eyes:     General:        Right eye: No discharge.        Left eye: No discharge.      Extraocular Movements: Extraocular movements intact.  Neck:      Comments: Patient's head is resting to the left. Can shake it in small amounts but is painful to try to range more than that. Cardiovascular:     Rate and Rhythm: Normal rate and regular rhythm.     Heart sounds: Normal heart sounds.  Pulmonary:     Effort: Pulmonary effort is normal.     Breath sounds: Normal breath sounds.  Abdominal:     Palpations: Abdomen is soft.     Tenderness: There is no abdominal tenderness.  Musculoskeletal:        General: No edema.     Cervical back: Neck supple. Torticollis present. Muscular tenderness present.  Skin:    General: Skin is warm and dry.  Neurological:     Mental Status: He is alert.     Comments: CN 3-12 grossly intact. 5/5 strength in all 4 extremities. Grossly normal sensation. Normal finger to nose.   Psychiatric:        Mood and Affect: Mood is not anxious.     ED Results / Procedures / Treatments   Labs (all labs ordered are listed, but only abnormal results are displayed) Labs Reviewed - No data to display  EKG None  Radiology No results found.  Procedures Procedures (including critical care time)  Medications Ordered in ED Medications  ketorolac (TORADOL) injection 30 mg (has no administration in time range)    ED Course  I have reviewed the triage vital signs and the nursing notes.  Pertinent labs & imaging results that were available during my care of the patient were reviewed by me and considered in my medical decision making (see chart for details).    MDM Rules/Calculators/A&P                          It appears that the patient has torticollis, likely from neck muscle spasm.  At this point without fever and otherwise well appearance, my suspicion of CNS emergency such as arterial dissection, head bleed, spinal cord emergency, CNS infection like meningitis, is all quite low.  I think ACS is unlikely I suspect this is more muscular.  He drove  himself to the emergency department and is unable to get a ride so he does not want anything sedating in the emergency department.  Will give IM Toradol and recommend he continue to take Tylenol at home.  Will give muscle relaxer.  We discussed return precautions. Final Clinical Impression(s) /  ED Diagnoses Final diagnoses:  Muscle spasms of neck    Rx / DC Orders ED Discharge Orders         Ordered    naproxen (NAPROSYN) 375 MG tablet  2 times daily with meals        12/22/20 0957    cyclobenzaprine (FLEXERIL) 10 MG tablet  3 times daily PRN        12/22/20 0957           Pricilla Loveless, MD 12/22/20 1000

## 2023-03-21 ENCOUNTER — Encounter (HOSPITAL_COMMUNITY): Payer: Self-pay | Admitting: Emergency Medicine

## 2023-03-21 ENCOUNTER — Ambulatory Visit (HOSPITAL_COMMUNITY)
Admission: EM | Admit: 2023-03-21 | Discharge: 2023-03-21 | Disposition: A | Discharge status: Home / Self Care | Payer: BC Managed Care – PPO | Attending: Internal Medicine | Admitting: Internal Medicine

## 2023-03-21 DIAGNOSIS — H6122 Impacted cerumen, left ear: Secondary | ICD-10-CM | POA: Diagnosis not present

## 2023-03-21 MED ORDER — CARBAMIDE PEROXIDE 6.5 % OT SOLN: 5.0000 [drp] | Freq: Two times a day (BID) | OTIC | 0 refills | Status: DC

## 2023-03-21 NOTE — ED Provider Notes (Signed)
[facility]    CSN: [id number] Arrival date & time: 03/21/23  1538      History   Chief Complaint Chief Complaint  Patient presents with   Otalgia    HPI Dean Brooks is a 62 y.o. male comes to the urgent care with a 2 to 3-week history of left ear pain.  Pain is of moderate severity, sharp and throbbing, constant and aggravated by chewing.  No noted relieving factors.  Patient complains of decreased hearing in the left ear.  No discharge from the left ear.  No trauma to the left ear.  No dizziness or feeling of spinning sensation.  No nausea or vomiting. HPI  Past Medical History:  Diagnosis Date   Hypertension     Patient Active Problem List   Diagnosis Date Noted   AKI (acute kidney injury) 06/03/2013   Dehydration 06/03/2013   Hypercalcemia 06/03/2013   Elevated glucose 06/03/2013   Hypertension 06/03/2013   Alcohol abuse, daily use 06/03/2013    Past Surgical History:  Procedure Laterality Date   COLONOSCOPY  2013   TONSILLECTOMY         Home Medications    Prior to Admission medications   Medication Sig Start Date End Date Taking? Authorizing Provider  carbamide peroxide (DEBROX) 6.5 % OTIC solution Place 5 drops into the left ear 2 (two) times daily. 03/21/23  Yes Samariyah Cowles, Myrene Galas, MD  amLODipine (NORVASC) 5 MG tablet Take 5 mg by mouth daily.    [provider]  cyclobenzaprine (FLEXERIL) 10 MG tablet Take 1 tablet (10 mg total) by mouth 3 (three) times daily as needed for muscle spasms. 12/22/20   Sherwood Gambler, MD  hydrochlorothiazide (HYDRODIURIL) 25 MG tablet Take 1 tablet (25 mg total) by mouth daily. Hold for 1 week then restart 06/04/13   Domenic Polite, MD  ketoconazole (NIZORAL) 2 % cream Apply 1 application topically 2 (two) times daily as needed for irritation. 09/23/20   Volney American, PA-C  losartan (COZAAR) 100 MG tablet Take by mouth. 08/06/17   [provider]  METFORMIN HCL PO Take by mouth.     [provider]  methylPREDNISolone (MEDROL DOSEPAK) 4 MG TBPK tablet tad 07/11/20   Raylene Everts, MD  naproxen (NAPROSYN) 375 MG tablet Take 1 tablet (375 mg total) by mouth 2 (two) times daily with a meal. 12/22/20   Sherwood Gambler, MD  SIMVASTATIN PO Take by mouth.    [provider]  triamcinolone cream (KENALOG) 0.1 % Apply 1 application topically 2 (two) times daily. Can start this if no benefit from ketoconazole cream 09/23/20   Volney American, PA-C    Family History Family History  Problem Relation Age of Onset   Colon cancer Neg Hx    Esophageal cancer Neg Hx    Rectal cancer Neg Hx    Stomach cancer Neg Hx     Social History Social History   Tobacco Use   Smoking status: Former    Packs/day: 0    Types: Cigarettes    Quit date: 06/03/2012    Years since quitting: 10.8   Smokeless tobacco: Never  Vaping Use   Vaping Use: Never used  Substance Use Topics   Alcohol use: Yes    Comment: every weekends   Drug use: No     Allergies   Lisinopril   Review of Systems Review of Systems As per HPI  Physical Exam Triage Vital Signs ED Triage Vitals  Enc  Vitals Group     BP 03/21/23 1556 (!) 159/93     Pulse Rate 03/21/23 1556 67     Resp 03/21/23 1556 18     Temp 03/21/23 1556 99.1 F (37.3 C)     Temp Source 03/21/23 1556 Oral     SpO2 03/21/23 1556 95 %     Weight --      Height --      Head Circumference --      Peak Flow --      Pain Score 03/21/23 1554 5     Pain Loc --      Pain Edu? --      Excl. in [place]? --    No data found.  Updated Vital Signs BP (!) 159/93 (BP Location: Right Arm)   Pulse 67   Temp 99.1 F (37.3 C) (Oral)   Resp 18   SpO2 95%   Visual Acuity Right Eye Distance:   Left Eye Distance:   Bilateral Distance:    Right Eye Near:   Left Eye Near:    Bilateral Near:     Physical Exam Vitals and nursing note reviewed.  Constitutional:      General: He is not in acute distress.     Appearance: Normal appearance. He is not ill-appearing.  HENT:     Right Ear: Tympanic membrane normal.     Left Ear: There is impacted cerumen.  Eyes:     Extraocular Movements: Extraocular movements intact.     Pupils: Pupils are equal, round, and reactive to light.  Cardiovascular:     Rate and Rhythm: Normal rate and regular rhythm.  Neurological:     Mental Status: He is alert.      UC Treatments / Results  Labs (all labs ordered are listed, but only abnormal results are displayed) Labs Reviewed - No data to display  EKG   Radiology No results found.  Procedures Procedures (including critical care time)  Medications Ordered in UC Medications - No data to display  Initial Impression / Assessment and Plan / UC Course  I have reviewed the triage vital signs and the nursing notes.  Pertinent labs & imaging results that were available during my care of the patient were reviewed by me and considered in my medical decision making (see chart for details).     1.  Cerumen impaction noted left ear: Ear irrigation was completed Patient's hearing has improved Ear exam after ear irrigation still showed significant amount of wax in the left ear.  Hearing is improved.  Pain is improved. Debrox twice daily for 3 days. Final Clinical Impressions(s) / UC Diagnoses   Final diagnoses:  Hearing loss of left ear due to cerumen impaction     Discharge Instructions      Please use medications as prescribed Avoid using Q-tips to clean your ears Tylenol/Motrin as needed for pain Return to urgent care if you have any other concerns.   ED Prescriptions     Medication Sig Dispense Auth. Provider   carbamide peroxide (DEBROX) 6.5 % OTIC solution Place 5 drops into the left ear 2 (two) times daily. 15 mL Mackay Hanauer, Myrene Galas, MD      PDMP not reviewed this encounter.   Chase Picket, MD 03/21/23 [phone]

## 2023-03-21 NOTE — ED Triage Notes (Signed)
Pt reports left ear pain. States when he chew he can feel the pain. This has been ongoing for the past 2/3 weeks. States the past day the pain has been more consistent and now feels clogged.

## 2023-03-21 NOTE — Discharge Instructions (Addendum)
Please use medications as prescribed Avoid using Q-tips to clean your ears Tylenol/Motrin as needed for pain Return to urgent care if you have any other concerns.

## 2023-09-13 ENCOUNTER — Ambulatory Visit (HOSPITAL_COMMUNITY)
Admission: EM | Admit: 2023-09-13 | Discharge: 2023-09-13 | Disposition: A | Discharge status: Home / Self Care | Payer: BC Managed Care – PPO | Attending: Emergency Medicine | Admitting: Emergency Medicine

## 2023-09-13 ENCOUNTER — Encounter (HOSPITAL_COMMUNITY): Payer: Self-pay | Admitting: *Deleted

## 2023-09-13 DIAGNOSIS — R21 Rash and other nonspecific skin eruption: Secondary | ICD-10-CM | POA: Diagnosis not present

## 2023-09-13 MED ORDER — TRIAMCINOLONE ACETONIDE 0.1 % EX CREA: 1.0000 | TOPICAL_CREAM | Freq: Two times a day (BID) | CUTANEOUS | 0 refills | Status: AC

## 2023-09-13 MED ORDER — KETOCONAZOLE 2 % EX CREA: 1.0000 | TOPICAL_CREAM | Freq: Two times a day (BID) | CUTANEOUS | 0 refills | Status: AC

## 2023-09-13 NOTE — ED Triage Notes (Signed)
Pt states that he has a rash on his back x 3 weeks. He has been putting OTC meds on it (sea breeze, alcohol). States he has been seen before for this same issue.   Pt states he needs refills of creams given for rash last time.

## 2023-09-13 NOTE — Discharge Instructions (Addendum)
Please apply the ketoconazole and Kenalog twice daily to your rash.  Ensure you are using unscented soaps and lotions such as Dove sensitive skin.  You can also do an unscented lotion like Lubriderm.  If your rash persists despite these interventions, please follow-up with a primary care provider for further evaluation.  Return to clinic for new or urgent symptoms.

## 2023-09-13 NOTE — ED Provider Notes (Signed)
[facility]    CSN: [id number] Arrival date & time: 09/13/23  1848      History   Chief Complaint Chief Complaint  Patient presents with   Rash    HPI Dean Brooks is a 62 y.o. male.   Patient presents to clinic over an itchy rash to his right thoracic back.  Rashes been present for about 3 weeks, patient is unsure.  The patient has been scratching the rash at night.  He has not used any new soaps, detergents or lotions.  He has tried topical Sea breeze and topical alcohol.  The history is provided by the patient and medical records.  Rash   Past Medical History:  Diagnosis Date   Hypertension     Patient Active Problem List   Diagnosis Date Noted   AKI (acute kidney injury) ([facility]) 06/03/2013   Dehydration 06/03/2013   Hypercalcemia 06/03/2013   Elevated glucose 06/03/2013   Hypertension 06/03/2013   Alcohol abuse, daily use 06/03/2013    Past Surgical History:  Procedure Laterality Date   COLONOSCOPY  2013   TONSILLECTOMY         Home Medications    Prior to Admission medications   Medication Sig Start Date End Date Taking? Authorizing Provider  amLODipine (NORVASC) 5 MG tablet Take 5 mg by mouth daily.   Yes [provider]  hydrochlorothiazide (HYDRODIURIL) 25 MG tablet Take 1 tablet (25 mg total) by mouth daily. Hold for 1 week then restart 06/04/13  Yes Zannie Cove, MD  ketoconazole (NIZORAL) 2 % cream Apply 1 Application topically 2 (two) times daily. 09/13/23  Yes Rinaldo Ratel, [place] N, FNP  losartan (COZAAR) 100 MG tablet Take by mouth. 08/06/17  Yes [provider]  METFORMIN HCL PO Take by mouth.   Yes [provider]  naproxen (NAPROSYN) 375 MG tablet Take 1 tablet (375 mg total) by mouth 2 (two) times daily with a meal. 12/22/20  Yes Pricilla Loveless, MD  SIMVASTATIN PO Take by mouth.   Yes [provider]  triamcinolone cream (KENALOG) 0.1 % Apply 1 Application topically 2 (two) times daily.  09/13/23  Yes Rinaldo Ratel, [place] N, FNP    Family History Family History  Problem Relation Age of Onset   Colon cancer Neg Hx    Esophageal cancer Neg Hx    Rectal cancer Neg Hx    Stomach cancer Neg Hx     Social History Social History   Tobacco Use   Smoking status: Former    Current packs/day: 0.00    Types: Cigarettes    Quit date: 06/03/2012    Years since quitting: 11.2   Smokeless tobacco: Never  Vaping Use   Vaping status: Never Used  Substance Use Topics   Alcohol use: Yes    Comment: every weekends   Drug use: No     Allergies   Lisinopril   Review of Systems Review of Systems  Skin:  Positive for rash.     Physical Exam Triage Vital Signs ED Triage Vitals  Encounter Vitals Group     BP 09/13/23 1926 118/71     Systolic BP Percentile --      Diastolic BP Percentile --      Pulse Rate 09/13/23 1926 (!) 102     Resp 09/13/23 1926 18     Temp 09/13/23 1926 98.1 F (36.7 C)     Temp Source 09/13/23 1926 Oral     SpO2 09/13/23 1926 96 %  Weight --      Height --      Head Circumference --      Peak Flow --      Pain Score 09/13/23 1924 0     Pain Loc --      Pain Education --      Exclude from Growth Chart --    No data found.  Updated Vital Signs BP 118/71 (BP Location: Left Arm)   Pulse (!) 102   Temp 98.1 F (36.7 C) (Oral)   Resp 18   SpO2 96%   Visual Acuity Right Eye Distance:   Left Eye Distance:   Bilateral Distance:    Right Eye Near:   Left Eye Near:    Bilateral Near:     Physical Exam Vitals and nursing note reviewed.  Constitutional:      Appearance: Normal appearance.  HENT:     Head: Normocephalic and atraumatic.     Right Ear: External ear normal.     Left Ear: External ear normal.     Nose: Nose normal.     Mouth/Throat:     Mouth: Mucous membranes are moist.  Eyes:     Conjunctiva/sclera: Conjunctivae normal.  Cardiovascular:     Rate and Rhythm: Normal rate.  Pulmonary:     Effort: Pulmonary  effort is normal. No respiratory distress.  Musculoskeletal:        General: Normal range of motion.  Skin:    General: Skin is warm and dry.     Findings: Rash present.       Neurological:     General: No focal deficit present.     Mental Status: He is alert and oriented to person, place, and time.  Psychiatric:        Mood and Affect: Mood normal.        Behavior: Behavior normal. Behavior is cooperative.      UC Treatments / Results  Labs (all labs ordered are listed, but only abnormal results are displayed) Labs Reviewed - No data to display  EKG   Radiology No results found.  Procedures Procedures (including critical care time)  Medications Ordered in UC Medications - No data to display  Initial Impression / Assessment and Plan / UC Course  I have reviewed the triage vital signs and the nursing notes.  Pertinent labs & imaging results that were available during my care of the patient were reviewed by me and considered in my medical decision making (see chart for details).  Vitals and triage reviewed, patient is hemodynamically stable.  Itchy, scaling.  I crash to right thoracic spine.  Reports ketoconazole and Kenalog cream have worked well for him in the past when this happened years prior.  No recent changes.  Will trial ketoconazole and Kenalog.  Plan of care, follow-up care and return precautions given, no questions at this time.     Final Clinical Impressions(s) / UC Diagnoses   Final diagnoses:  Rash and nonspecific skin eruption     Discharge Instructions      Please apply the ketoconazole and Kenalog twice daily to your rash.  Ensure you are using unscented soaps and lotions such as Dove sensitive skin.  You can also do an unscented lotion like Lubriderm.  If your rash persists despite these interventions, please follow-up with a primary care provider for further evaluation.  Return to clinic for new or urgent symptoms.        ED  Prescriptions  Medication Sig Dispense Auth. Provider   triamcinolone cream (KENALOG) 0.1 % Apply 1 Application topically 2 (two) times daily. 30 g Rinaldo Ratel, [place] N, [place]   ketoconazole (NIZORAL) 2 % cream Apply 1 Application topically 2 (two) times daily. 30 g Doss Cybulski, [place] N, [place]      PDMP not reviewed this encounter.   Rosalea Withrow, [place] N, [place] 09/13/23 1949
